# Patient Record
Sex: Female | Born: 1965 | Race: White | Hispanic: No | Marital: Married | Smoking: Former smoker
Health system: Southern US, Community
[De-identification: ages and names within clinical notes are randomized; demographics above are authoritative.]

## PROBLEM LIST (undated history)

## (undated) DIAGNOSIS — E0590 Thyrotoxicosis, unspecified without thyrotoxic crisis or storm: Secondary | ICD-10-CM

## (undated) DIAGNOSIS — M1990 Unspecified osteoarthritis, unspecified site: Secondary | ICD-10-CM

## (undated) DIAGNOSIS — F4323 Adjustment disorder with mixed anxiety and depressed mood: Secondary | ICD-10-CM

## (undated) DIAGNOSIS — E890 Postprocedural hypothyroidism: Secondary | ICD-10-CM

## (undated) DIAGNOSIS — D219 Benign neoplasm of connective and other soft tissue, unspecified: Secondary | ICD-10-CM

## (undated) DIAGNOSIS — T7840XA Allergy, unspecified, initial encounter: Secondary | ICD-10-CM

## (undated) DIAGNOSIS — E0500 Thyrotoxicosis with diffuse goiter without thyrotoxic crisis or storm: Secondary | ICD-10-CM

## (undated) DIAGNOSIS — Z6372 Alcoholism and drug addiction in family: Secondary | ICD-10-CM

## (undated) DIAGNOSIS — E042 Nontoxic multinodular goiter: Secondary | ICD-10-CM

## (undated) HISTORY — PX: SHOULDER ARTHROSCOPY: SHX128

## (undated) HISTORY — DX: Alcoholism and drug addiction in family: Z63.72

## (undated) HISTORY — DX: Thyrotoxicosis with diffuse goiter without thyrotoxic crisis or storm: E05.00

## (undated) HISTORY — DX: Thyrotoxicosis, unspecified without thyrotoxic crisis or storm: E05.90

## (undated) HISTORY — DX: Postprocedural hypothyroidism: E89.0

## (undated) HISTORY — DX: Allergy, unspecified, initial encounter: T78.40XA

## (undated) HISTORY — DX: Unspecified osteoarthritis, unspecified site: M19.90

## (undated) HISTORY — DX: Benign neoplasm of connective and other soft tissue, unspecified: D21.9

## (undated) HISTORY — PX: FOOT SURGERY: SHX648

## (undated) HISTORY — DX: Nontoxic multinodular goiter: E04.2

## (undated) HISTORY — DX: Adjustment disorder with mixed anxiety and depressed mood: F43.23

---

## 1991-05-29 HISTORY — PX: LAPAROSCOPY: SHX197

## 2003-09-29 ENCOUNTER — Other Ambulatory Visit: Admission: RE | Admit: 2003-09-29 | Discharge: 2003-09-29 | Payer: Self-pay | Admitting: *Deleted

## 2004-01-03 ENCOUNTER — Ambulatory Visit (HOSPITAL_COMMUNITY): Admission: RE | Admit: 2004-01-03 | Discharge: 2004-01-03 | Payer: Self-pay | Admitting: *Deleted

## 2004-01-03 ENCOUNTER — Ambulatory Visit (HOSPITAL_BASED_OUTPATIENT_CLINIC_OR_DEPARTMENT_OTHER): Admission: RE | Admit: 2004-01-03 | Discharge: 2004-01-03 | Payer: Self-pay | Admitting: *Deleted

## 2004-01-03 ENCOUNTER — Encounter (INDEPENDENT_AMBULATORY_CARE_PROVIDER_SITE_OTHER): Payer: Self-pay | Admitting: *Deleted

## 2004-06-07 ENCOUNTER — Ambulatory Visit: Payer: Self-pay | Admitting: Family Medicine

## 2004-06-19 ENCOUNTER — Ambulatory Visit: Payer: Self-pay | Admitting: Family Medicine

## 2004-06-26 ENCOUNTER — Ambulatory Visit: Payer: Self-pay | Admitting: Family Medicine

## 2004-07-10 ENCOUNTER — Ambulatory Visit: Payer: Self-pay | Admitting: Family Medicine

## 2004-12-18 ENCOUNTER — Ambulatory Visit (HOSPITAL_COMMUNITY): Admission: RE | Admit: 2004-12-18 | Discharge: 2004-12-18 | Payer: Self-pay | Admitting: *Deleted

## 2005-01-02 ENCOUNTER — Ambulatory Visit: Payer: Self-pay | Admitting: Psychology

## 2005-01-26 ENCOUNTER — Ambulatory Visit: Payer: Self-pay | Admitting: Internal Medicine

## 2005-02-06 ENCOUNTER — Ambulatory Visit: Payer: Self-pay | Admitting: Family Medicine

## 2005-05-08 ENCOUNTER — Ambulatory Visit: Payer: Self-pay | Admitting: Internal Medicine

## 2005-05-28 HISTORY — PX: OTHER SURGICAL HISTORY: SHX169

## 2005-08-08 ENCOUNTER — Ambulatory Visit: Payer: Self-pay | Admitting: Family Medicine

## 2005-08-20 ENCOUNTER — Ambulatory Visit: Payer: Self-pay | Admitting: Psychology

## 2005-09-10 ENCOUNTER — Ambulatory Visit: Payer: Self-pay | Admitting: Psychology

## 2005-12-31 ENCOUNTER — Ambulatory Visit (HOSPITAL_BASED_OUTPATIENT_CLINIC_OR_DEPARTMENT_OTHER): Admission: RE | Admit: 2005-12-31 | Discharge: 2005-12-31 | Payer: Self-pay | Admitting: *Deleted

## 2005-12-31 ENCOUNTER — Encounter (INDEPENDENT_AMBULATORY_CARE_PROVIDER_SITE_OTHER): Payer: Self-pay | Admitting: *Deleted

## 2006-01-01 ENCOUNTER — Encounter: Admission: RE | Admit: 2006-01-01 | Discharge: 2006-01-01 | Payer: Self-pay | Admitting: *Deleted

## 2006-03-07 ENCOUNTER — Ambulatory Visit: Payer: Self-pay | Admitting: Internal Medicine

## 2006-10-24 ENCOUNTER — Ambulatory Visit: Payer: Self-pay | Admitting: Psychology

## 2006-10-24 DIAGNOSIS — F4323 Adjustment disorder with mixed anxiety and depressed mood: Secondary | ICD-10-CM

## 2006-10-24 HISTORY — DX: Adjustment disorder with mixed anxiety and depressed mood: F43.23

## 2006-10-31 ENCOUNTER — Ambulatory Visit: Payer: Self-pay | Admitting: Family Medicine

## 2006-12-02 ENCOUNTER — Ambulatory Visit: Payer: Self-pay | Admitting: Psychology

## 2007-01-07 ENCOUNTER — Encounter: Admission: RE | Admit: 2007-01-07 | Discharge: 2007-01-07 | Payer: Self-pay | Admitting: Obstetrics and Gynecology

## 2007-02-03 ENCOUNTER — Ambulatory Visit: Payer: Self-pay | Admitting: Psychology

## 2007-02-27 ENCOUNTER — Ambulatory Visit: Payer: Self-pay | Admitting: Family Medicine

## 2007-03-13 ENCOUNTER — Ambulatory Visit: Payer: Self-pay | Admitting: Family Medicine

## 2007-04-11 ENCOUNTER — Ambulatory Visit: Payer: Self-pay | Admitting: Internal Medicine

## 2007-04-14 LAB — CONVERTED CEMR LAB
Free T4: 4.2 ng/dL — ABNORMAL HIGH (ref 0.6–1.6)
T3, Free: 19.8 pg/mL — ABNORMAL HIGH (ref 2.3–4.2)
TSH: 0.02 microintl units/mL — ABNORMAL LOW (ref 0.35–5.50)

## 2007-04-22 ENCOUNTER — Encounter (HOSPITAL_COMMUNITY): Admission: RE | Admit: 2007-04-22 | Discharge: 2007-06-06 | Payer: Self-pay | Admitting: Internal Medicine

## 2007-04-25 ENCOUNTER — Telehealth: Payer: Self-pay | Admitting: Internal Medicine

## 2007-04-30 ENCOUNTER — Encounter: Admission: RE | Admit: 2007-04-30 | Discharge: 2007-04-30 | Payer: Self-pay | Admitting: Internal Medicine

## 2007-04-30 ENCOUNTER — Ambulatory Visit: Payer: Self-pay | Admitting: Internal Medicine

## 2007-05-01 ENCOUNTER — Telehealth (INDEPENDENT_AMBULATORY_CARE_PROVIDER_SITE_OTHER): Payer: Self-pay | Admitting: *Deleted

## 2007-05-02 ENCOUNTER — Telehealth (INDEPENDENT_AMBULATORY_CARE_PROVIDER_SITE_OTHER): Payer: Self-pay | Admitting: *Deleted

## 2007-05-02 ENCOUNTER — Ambulatory Visit: Payer: Self-pay | Admitting: Family Medicine

## 2007-05-05 ENCOUNTER — Encounter: Admission: RE | Admit: 2007-05-05 | Discharge: 2007-05-05 | Payer: Self-pay | Admitting: Internal Medicine

## 2007-05-06 ENCOUNTER — Telehealth: Payer: Self-pay | Admitting: Internal Medicine

## 2007-05-13 ENCOUNTER — Ambulatory Visit: Payer: Self-pay | Admitting: Internal Medicine

## 2007-06-13 ENCOUNTER — Ambulatory Visit: Payer: Self-pay | Admitting: Internal Medicine

## 2007-06-18 LAB — CONVERTED CEMR LAB
Free T4: 2.6 ng/dL — ABNORMAL HIGH (ref 0.6–1.6)
T3, Free: 7.4 pg/mL — ABNORMAL HIGH (ref 2.3–4.2)
TSH: 0.03 microintl units/mL — ABNORMAL LOW (ref 0.35–5.50)

## 2007-07-11 ENCOUNTER — Ambulatory Visit: Payer: Self-pay | Admitting: Internal Medicine

## 2007-07-14 LAB — CONVERTED CEMR LAB
T3, Free: 3.7 pg/mL (ref 2.3–4.2)
TSH: 0.02 microintl units/mL — ABNORMAL LOW (ref 0.35–5.50)

## 2007-07-16 ENCOUNTER — Ambulatory Visit: Payer: Self-pay | Admitting: Internal Medicine

## 2007-08-08 ENCOUNTER — Ambulatory Visit: Payer: Self-pay | Admitting: Internal Medicine

## 2007-08-11 LAB — CONVERTED CEMR LAB: Free T4: 0.7 ng/dL (ref 0.6–1.6)

## 2007-08-18 ENCOUNTER — Ambulatory Visit: Payer: Self-pay | Admitting: Family Medicine

## 2007-09-15 ENCOUNTER — Ambulatory Visit: Payer: Self-pay | Admitting: Sports Medicine

## 2007-09-19 ENCOUNTER — Ambulatory Visit: Payer: Self-pay | Admitting: Internal Medicine

## 2007-09-29 ENCOUNTER — Ambulatory Visit: Payer: Self-pay | Admitting: Sports Medicine

## 2007-10-16 ENCOUNTER — Ambulatory Visit: Payer: Self-pay | Admitting: Sports Medicine

## 2007-10-23 ENCOUNTER — Ambulatory Visit: Payer: Self-pay | Admitting: Internal Medicine

## 2007-10-23 LAB — CONVERTED CEMR LAB: Free T4: 0.9 ng/dL (ref 0.6–1.6)

## 2007-10-27 ENCOUNTER — Ambulatory Visit: Payer: Self-pay | Admitting: Family Medicine

## 2007-10-29 ENCOUNTER — Ambulatory Visit: Payer: Self-pay | Admitting: Internal Medicine

## 2007-12-11 ENCOUNTER — Ambulatory Visit: Payer: Self-pay | Admitting: Internal Medicine

## 2007-12-16 LAB — CONVERTED CEMR LAB
Free T4: 1.2 ng/dL (ref 0.6–1.6)
T3, Free: 4.3 pg/mL — ABNORMAL HIGH (ref 2.3–4.2)

## 2007-12-22 ENCOUNTER — Ambulatory Visit: Payer: Self-pay | Admitting: Family Medicine

## 2008-01-08 ENCOUNTER — Encounter: Admission: RE | Admit: 2008-01-08 | Discharge: 2008-01-08 | Payer: Self-pay

## 2008-01-08 ENCOUNTER — Ambulatory Visit: Payer: Self-pay | Admitting: Endocrinology

## 2008-01-08 DIAGNOSIS — E042 Nontoxic multinodular goiter: Secondary | ICD-10-CM | POA: Insufficient documentation

## 2008-01-08 HISTORY — DX: Nontoxic multinodular goiter: E04.2

## 2008-02-18 ENCOUNTER — Ambulatory Visit: Payer: Self-pay | Admitting: Endocrinology

## 2008-02-23 ENCOUNTER — Encounter (HOSPITAL_COMMUNITY): Admission: RE | Admit: 2008-02-23 | Discharge: 2008-05-05 | Payer: Self-pay | Admitting: Endocrinology

## 2008-02-24 ENCOUNTER — Ambulatory Visit: Payer: Self-pay | Admitting: Family Medicine

## 2008-02-26 ENCOUNTER — Ambulatory Visit: Payer: Self-pay | Admitting: Internal Medicine

## 2008-07-19 ENCOUNTER — Ambulatory Visit: Payer: Self-pay | Admitting: Endocrinology

## 2008-07-19 DIAGNOSIS — E890 Postprocedural hypothyroidism: Secondary | ICD-10-CM

## 2008-07-19 DIAGNOSIS — E039 Hypothyroidism, unspecified: Secondary | ICD-10-CM | POA: Insufficient documentation

## 2008-07-19 HISTORY — DX: Postprocedural hypothyroidism: E89.0

## 2008-07-20 LAB — CONVERTED CEMR LAB: TSH: 4.05 microintl units/mL (ref 0.35–5.50)

## 2008-09-02 ENCOUNTER — Ambulatory Visit: Payer: Self-pay | Admitting: Psychology

## 2009-01-20 ENCOUNTER — Encounter: Admission: RE | Admit: 2009-01-20 | Discharge: 2009-01-20 | Payer: Self-pay | Admitting: Obstetrics and Gynecology

## 2009-02-04 ENCOUNTER — Telehealth: Payer: Self-pay | Admitting: Endocrinology

## 2009-04-26 ENCOUNTER — Encounter: Payer: Self-pay | Admitting: Internal Medicine

## 2009-08-18 ENCOUNTER — Ambulatory Visit: Payer: Self-pay | Admitting: Endocrinology

## 2009-08-18 LAB — CONVERTED CEMR LAB: TSH: 4.18 microintl units/mL (ref 0.35–5.50)

## 2009-08-23 ENCOUNTER — Telehealth (INDEPENDENT_AMBULATORY_CARE_PROVIDER_SITE_OTHER): Payer: Self-pay | Admitting: *Deleted

## 2009-11-22 ENCOUNTER — Telehealth: Payer: Self-pay | Admitting: Psychology

## 2009-11-29 ENCOUNTER — Telehealth: Payer: Self-pay | Admitting: Psychology

## 2010-01-05 ENCOUNTER — Ambulatory Visit: Payer: Self-pay | Admitting: Psychology

## 2010-01-26 ENCOUNTER — Encounter: Admission: RE | Admit: 2010-01-26 | Discharge: 2010-01-26 | Payer: Self-pay | Admitting: Obstetrics and Gynecology

## 2010-05-24 ENCOUNTER — Encounter: Payer: Self-pay | Admitting: Internal Medicine

## 2010-06-18 ENCOUNTER — Encounter: Payer: Self-pay | Admitting: Internal Medicine

## 2010-06-27 NOTE — Assessment & Plan Note (Signed)
Summary: Behavioral Medicine Follow-up   Primary Care Allison Campos:  Community PCP   History of Present Illness: Allison Campos presents since her last appt in April of this past year.  She says not much has changed in terms of her relationship with her husband.  She continues to work full-time and enjoys that.  Her focus is on friendships again this visit.    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  Affect is consistent.  She has close, consistent / stable relationships with several individuals.  They are long-term friendships and all of them are long-distance.  She has engaged in a number of attempts to find relationships in [place] and has come up wanting.  She wonders whether it is something about her.  Sounds like she is taking good opportunities.  There is a chance that her need is a little great and people pick up on that.  We talked about intensity.  It could also be that her standards are really very high (appropriately so) and she isn't willing to settle.  We likened it to dating relationships and finding someone you want to "marry into a friendship."  She has more superficial friendships in [place] but has not found "the one."  Reassurance seemed to be a good experience for her along with validation.  She is leaving for [place] tomorrow to visit her mom and reconnect with long-term friends.  She is excited.  She will call as needed.   Orders: Therapy 40-50- min- [facility] (81191)  Complete Medication List: 1)  Fluticasone Propionate 50 Mcg/act Susp (Fluticasone propionate) .... Once daily as needed 2)  Zyrtec Allergy 10 Mg Tabs (Cetirizine hcl) .... Take 1 tablet by mouth once a day as needed 3)  Viactiv Multi-vitamin Chew (Multiple vitamins-calcium) .... Qd

## 2010-06-27 NOTE — Assessment & Plan Note (Signed)
Summary: f/u appt per pt/#/cd   Vital Signs:  Patient profile:   45 year old female Height:      63 inches (160.02 cm) Weight:      153.25 pounds (69.66 kg) BMI:     27.25 O2 Sat:      98 % on Room air Temp:     97.2 degrees F (36.22 degrees C) oral Pulse rate:   77 / minute BP sitting:   112 / 78  (left arm) Cuff size:   regular  Vitals Entered By: Josph Macho RMA (August 18, 2009 8:15 AM)  O2 Flow:  Room air CC: Follow-up visit/ pt states she is no longer taking Promethazine or Levothyroxine Sodium/ CF Is Patient Diabetic? No   Referring Provider:  Dr. Cato Mulligan Primary Provider:  Community PCP  CC:  Follow-up visit/ pt states she is no longer taking Promethazine or Levothyroxine Sodium/ CF.  History of Present Illness: pt had i-131 rx for hyperthyroidism in 2008.  she does not take the synthroid, or any other thyroid medication.  she does not notice the goiter.     Current Medications (verified): 1)  Fluticasone Propionate 50 Mcg/act Susp (Fluticasone Propionate) .... Once Daily As Needed 2)  Zyrtec Allergy 10 Mg  Tabs (Cetirizine Hcl) .... Take 1 Tablet By Mouth Once A Day As Needed 3)  Viactiv Multi-Vitamin   Chew (Multiple Vitamins-Calcium) .... Qd 4)  Promethazine Hcl 25 Mg  Tabs (Promethazine Hcl) .... One Every 8 Hrs As Needed 5)  Levothyroxine Sodium 50 Mcg  Tabs (Levothyroxine Sodium) .[name].. 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 Married Former Smoker Alcohol use-yes Drug use-no Regular  exercise-yes works as cpa  Review of Systems       pt c/o cold intolerance  Physical Exam  General:  normal appearance.   Neck:  thyroid slightly enlarged with irregular surface, but no discrete nodule. Additional Exam:  FastTSH                   4.18 uIU/mL      Impression & Recommendations:  Problem # 1:  HYPOTHYROIDISM, POST-RADIATION (ICD-244.1) no med is needed now  Problem # 2:  GOITER, MULTINODULAR (ICD-241.1) apparently improved  Other Orders: TLB-TSH (Thyroid Stimulating Hormone) (84443-TSH) Est. Patient Level III (29562)  Patient Instructions: 1)  tests are being ordered for you today.  a few days after the test(s), please call [phone] to hear your test results. 2)  Please schedule a follow-up appointment in 1 year. 3)  (update: i left message on phone-tree:  rx as we discussed)

## 2010-06-27 NOTE — Progress Notes (Signed)
Summary: Reschedule beh med  Phone Note Call from Patient   Caller: Patient Call For: Spero Geralds, Psy.D. Summary of Call: patient left VM on Friday canceling appt.  We rescheduled for August 11th at 3:00. Initial call taken by: Spero Geralds PsyD,  November 29, 2009 2:35 PM

## 2010-06-27 NOTE — Progress Notes (Signed)
----   Converted from flag ---- ---- 08/23/2009 8:51 AM, Minus Breeding MD wrote: she does not need any treatment for the thyroid, as blood test was normal.  ret 1 year.  ---- 08/23/2009 8:23 AM, Josph Macho RMA wrote: I don't see what RX you want pt to continue and pt states she doesn't remember anything being discussed. ------------------------------  Phone Note Outgoing Call   Summary of Call: Left message for pt to return my call. Initial call taken by: Josph Macho RMA,  August 23, 2009 9:13 AM  Follow-up for Phone Call        Informed pt Follow-up by: Josph Macho RMA,  August 23, 2009 10:18 AM

## 2010-06-27 NOTE — Progress Notes (Signed)
Summary: Schedule Beh-med  Phone Note Call from Patient   Caller: Patient Call For: Spero Geralds, Psy.D. Summary of Call: Patient called to schedule.  July 5th at 11:00. Initial call taken by: Spero Geralds PsyD,  November 22, 2009 2:12 PM

## 2010-06-29 NOTE — Miscellaneous (Signed)
Summary: Immunization Entry   Immunization History:  Influenza Immunization History:    Influenza:  historical (05/22/2010)

## 2010-10-13 NOTE — Op Note (Signed)
Allison Campos, Allison Campos                           ACCOUNT NO.:  [id number]   MEDICAL RECORD NO.:  [id number]                   PATIENT TYPE:  AMB   LOCATION:  [facility]                                 FACILITY:  [facility]   PHYSICIAN:  Pershing Cox, M.D.            DATE OF BIRTH:  August 26, 1965   DATE OF PROCEDURE:  01/03/2004  DATE OF DISCHARGE:                                 OPERATIVE REPORT   PREOPERATIVE DIAGNOSIS:  Metrorrhagia and endometrial polyp.   POSTOPERATIVE DIAGNOSIS:  Metrorrhagia and endometrial polyp.   PROCEDURE:  1. Exam under anesthesia.  2. Fractional D&C.  3. Hysteroscopy with resection of endometrial polyp and small uterine myoma.   SURGEON:  Pershing Cox, M.D.   INDICATIONS FOR PROCEDURE:  This patient has a history of bleeding between  menstrual periods.  She had had a work-up in the past in [place] and in  our office had a sonogram performed which showed clear evidence of a polyp.  She is brought to the operating room today for resection.   OPERATIVE FINDINGS:  The patient's uterus is retroverted.  The cavity sounds  to 8 cm.  There was a polyp filling a small uterine fundus.  There was also  firm, textured mass on the anterior fundus which was resected and is  compatible with a small myoma.   DESCRIPTION OF PROCEDURE:  Allison Campos was brought to the operating room  with an IV in place.  She was placed supine on the OR table, and IV sedation  was administered.  LMA for general endotracheal anesthesia was then  administered.  She was placed into Allen stirrups, and Hibiclens was used to  prep her vagina, perineum, and upper thighs.  She was then draped for a  sterile vaginal procedure.  The bladder was emptied with a sterile catheter.  Bivalve speculum was introduced into the vagina, and the cervix was  visualized.  Marcaine 0.25% was injected into the anterior cervix which was  then grasped with a single-tooth tenaculum.  Paracervical block was  administered at the 3, 4, 7, and 8 positions using a total volume of 20 mL  of this solution.  Kevorkian curette was used to obtain endocervical  curettings, the uterine sound then passed to a depth of 8 cm in a  retroverted position.  Serial Pratt dilators were used to dilate the cervix  to size 33 and then 35, and the resectoscope was introduced.  Using through-  and-through sorbitol irrigation, the cavity was visualized.  Polyp was seen  in the anterior fundus.  Portions of this polyp were resected, and then  polyp forceps were used to remove the large bulk of the polyp.  The  resectoscope was reintroduced, and the endometrial wall was explored with  the loop.  There was an irregular area on the anterior fundus which was  resected with 110-110 blend 1 current.  The cavity was inspected  and made  hemostatic.  There was no evidence of other filling defects.  The small  sharp curette was then used to thoroughly  curette all the endometrial walls at the end of the procedure.  The sound  then passed again to a depth of 8 cm.  Fluid deficit 200 mL on soaked  sponges.  Specimens:  Endocervical curettings, endometrial polyp, and  endometrial curettings.  Complications none.                                               Pershing Cox, M.D.    MAJ/MEDQ  D:  01/03/2004  T:  01/03/2004  Job:  [id number]

## 2010-10-13 NOTE — Op Note (Signed)
NAMEAUDREYANA, Allison Campos               ACCOUNT NO.:  [id number]   MEDICAL RECORD NO.:  [id number]          PATIENT TYPE:  AMB   LOCATION:  [facility]                         [facility]AC[facility]   PHYSICIAN:  Pershing Cox, M.D.DATE OF BIRTH:  1966-03-24   DATE OF PROCEDURE:  12/31/2005  DATE OF DISCHARGE:                                 OPERATIVE REPORT   PREOPERATIVE DIAGNOSES:  1.  Dysfunctional uterine bleeding.  2.  Endometrial polyp on hydrosonogram.   POSTOPERATIVE DIAGNOSES:  1.  Dysfunctional uterine bleeding.  2.  Endometrial polyp on hydrosonogram.   PROCEDURE:  1.  Examination under anesthesia.  2.  Fractional dilatation and curettage.  3.  Hysteroscopy with resection.   SURGEON:  Pershing Cox, M.D.   ASSISTANT:  None.   INDICATIONS FOR PROCEDURE:  Hevin Jeffcoat is 29.  She has a history of  dysfunctional uterine bleeding and in the 2005 had a D&C, hysteroscopy with  resection of an endometrial polyp.  She presented this year at annual  examination with persistent symptoms.  Sonogram was performed which showed a  thickened endometrium.  This was followed by hydrosonogram which showed a  small fundal polyp and she is brought to the operating room today for  removal.   OPERATIVE FINDINGS:  The patient's endometrial cavity sounded to a depth of  9 cm.  There were several strips of endometrium hanging from the fundus  which were easily removed.  There were polypoid fragments of endometrium  along the left uterine wall.   PROCEDURE:  Dani Wallner was brought to the operating room with an IV in  place.  In the holding area, she received 1 gram of Ancef.  Supine on the OR  table, IV sedation was administered.  LMA was easily placed and she was then  placed into Allen stirrups.  Betadine was used to prep the perineum and  vagina.  A red rubber catheter was used to empty the bladder and sterile  linens were then applied.  Exam under anesthesia was performed.  Bivalve  speculum was inserted to the vagina and the cervix was grasped with a single-  tooth tenaculum.  Marcaine 0.25% was used to instill a 10 mL paracervical  block injecting at the 3, 4, 7 and 8 positions.  Endocervical curettings  were collected with a Kevorkian curet onto Telfa.  The uterus sound passed  to a depth of 9 cm, deviated slightly to the patient's right.  Serial Pratt  dilators were used to dilate to size 33 and the resectoscope was then  introduced.  Using through-and-through sorbitol irrigation, the cavity was  visualized and photographs were taken.  Next, the strips of endometrium were  approached using the right angle wire; however, they were very fragile.  The  hysteroscope was removed and the endometrial curettings were collected with  a #1 curet on to Telfa.  Once this had been completed, the resectoscope was  reintroduced into the cavity and the uterine walls were then visualized and  using the right  angle wire, serially curetted with the wire.  Where irregularities were  noted, this was resected.  After I had completed this, the resectoscope was  removed and the Meigs curet was used to curet the endometrial walls.  This  was again collected onto Telfa.  The cavity sounded 9 cm.  The procedure was  terminated.      Pershing Cox, M.D.  Electronically Signed     MAJ/MEDQ  D:  12/31/2005  T:  12/31/2005  Job:  [id number]

## 2010-12-13 ENCOUNTER — Telehealth: Payer: Self-pay | Admitting: Psychology

## 2010-12-13 NOTE — Telephone Encounter (Signed)
Requesting therapy referral for her and her son.  Gave Judithann Sauger and Tom Hedding names and numbers.  Also requesting therapy appt for herself.  Scheduled for July 27th at 9:00 a.m.

## 2010-12-22 ENCOUNTER — Ambulatory Visit (INDEPENDENT_AMBULATORY_CARE_PROVIDER_SITE_OTHER): Payer: BC Managed Care – PPO | Admitting: Psychology

## 2010-12-22 DIAGNOSIS — F4323 Adjustment disorder with mixed anxiety and depressed mood: Secondary | ICD-10-CM

## 2010-12-22 NOTE — Assessment & Plan Note (Signed)
Did not assess mood.  Affect is sad.  She is tearful throughout.  She would like to be detached as she sees the emotion as a sign that she is still connected.  Discussed this and normalized her conflicted thoughts and feelings.  No evidence of suicidal or homicidal ideation.  She is emotionally disparaging to herself (calls herself names).  No self-harm (i.e. Cutting).  Alcohol use is concerning to her.  She reports 1-2 glasses of wine per evening.  Will monitor.  She is at the beach next week with her boys.  Will meet the following week to see how she is doing.  In addition to patient instructions, provided a bibliotherapy resource on being kind to herself as she navigates tough waters.

## 2010-12-22 NOTE — Patient Instructions (Signed)
Please schedule a follow-up for:  August 8th at 9:00. Consider time frames - when does option A no longer apply?  What is option B? Stupid is no longer part of your vocabulary.  You will have to check yourself and you have practice at this.  Other words might be unhealthy or not the best decision.  There remain valid reasons for the decisions you are making on a daily basis.  Be gentle with yourself.

## 2010-12-22 NOTE — Progress Notes (Signed)
Allison Campos presents after a long hiatus from therapy.  She reports things have further deteriorated with her husband and she feels like she needs a plan to get out so she can maintain her own health.  Discussed best case scenario:  Her husband will lose his job on August 11th.  There is a chance he could find a new job in another state which would give Tempie the room to rebuild her sense of self and make some decisions.  Discussed other scenarios as well.  Her goals for the visit in addition to talking plans was to identify how to set reasonable boundaries and to work on not being so hard on herself.

## 2010-12-29 ENCOUNTER — Other Ambulatory Visit: Payer: Self-pay | Admitting: Obstetrics and Gynecology

## 2010-12-29 DIAGNOSIS — Z1231 Encounter for screening mammogram for malignant neoplasm of breast: Secondary | ICD-10-CM

## 2011-01-03 ENCOUNTER — Ambulatory Visit (INDEPENDENT_AMBULATORY_CARE_PROVIDER_SITE_OTHER): Payer: BC Managed Care – PPO | Admitting: Psychology

## 2011-01-03 DIAGNOSIS — F4323 Adjustment disorder with mixed anxiety and depressed mood: Secondary | ICD-10-CM

## 2011-01-03 NOTE — Assessment & Plan Note (Signed)
Report of mood is better than last meeting.  Affect is consistent with this.  She is tearful on a few occasions - less down on herself and more fear based (I think).    She likes to be in control and so she has some next steps in mind in order to feel like she is.  Continued visualization of the future is a piece of this as well.  She wishes to meet again to ensure she remains on the right track emotionally.  Scheduled for: August 28th at 9:00.

## 2011-01-03 NOTE — Progress Notes (Signed)
Allison Campos reported that the week at the beach was good.  She used walks to self-reflect and envision things for the future.  She thinks this is important in terms of preparing her for the next steps.  She plans on asking for marital counseling.  She is doubtful her husband will accept (or change) but she wants to be able to say she has done everything she could.  She does not want to be divorced but has come to the realization that if their marriage does not improve, it is either him or her and she can not sacrifice herself.  She reports she is doing better with negative self-talk.  She has also started noticing when she does things well and compliments herself on these occasions.  Her husband may not lose his job on the 11th after all.  Everything is up in the air.  He might have a second interview with a company in [place].

## 2011-01-23 ENCOUNTER — Ambulatory Visit (INDEPENDENT_AMBULATORY_CARE_PROVIDER_SITE_OTHER): Payer: BC Managed Care – PPO | Admitting: Psychology

## 2011-01-23 DIAGNOSIS — F4323 Adjustment disorder with mixed anxiety and depressed mood: Secondary | ICD-10-CM

## 2011-01-23 NOTE — Progress Notes (Signed)
Allison Campos presents today having made progress on her goals.  She had a conversation with her husband and she feels good about her approach.  He is not in favor of marital counseling but wants to change his behavior and has made some small changes.  She is clear they need a structured approach and is looking into a marriage encounter weekend.  Continues to set limits.  Reviewed these.  Is doing some "work" with a friend around developing her authentic self (friend is doing the same).  Enjoying this and getting something out of it.

## 2011-01-23 NOTE — Assessment & Plan Note (Signed)
Mood is good.  Affect is consistent.  She is in a better place emotionally than when she first presented this time.  She feels more in control and has a plan.  She is far less negative in her thinking and down on herself.  Provided a DVD resource that is a structured marital therapy workshop to present as an option to her husband along with the marriage retreat.  Will follow-up on that.  Will also follow-up on negative self-talk next visit as that was not addressed today (although it appears to be improved).  Follow-up three weeks or prn.

## 2011-01-30 ENCOUNTER — Ambulatory Visit
Admission: RE | Admit: 2011-01-30 | Discharge: 2011-01-30 | Disposition: A | Discharge status: Home / Self Care | Payer: BC Managed Care – PPO | Source: Ambulatory Visit | Attending: Obstetrics and Gynecology | Admitting: Obstetrics and Gynecology

## 2011-01-30 DIAGNOSIS — Z1231 Encounter for screening mammogram for malignant neoplasm of breast: Secondary | ICD-10-CM

## 2011-02-13 ENCOUNTER — Ambulatory Visit (INDEPENDENT_AMBULATORY_CARE_PROVIDER_SITE_OTHER): Payer: BC Managed Care – PPO | Admitting: Psychology

## 2011-02-13 DIAGNOSIS — F4323 Adjustment disorder with mixed anxiety and depressed mood: Secondary | ICD-10-CM

## 2011-02-14 NOTE — Assessment & Plan Note (Signed)
Mood is reported as good.  Affect is consistent.  She remains in a good place.  I think the shifts in the marriage are related to a feedback loop.  Her confidence and resolve grew and when she most recently set the limit, her husband responded appropriately (and surprisingly).  This created changes in [place] which likely created further positive changes in her husband.    Discussed how marriages (even healthy ones) go through ups and downs.  Allison Campos acknowledged it has not been perfect but because things are so much better, she is able to manage the low points well.    She elected to table seeing a lawyer for right now because it is inconsistent with her long-term goal and the current state of her marriage. Discussed warning signs that she would need to revisit.    She elected to schedule in about a month to keep checking in.  She may want to borrow DVD resource again as her husband has agreed to do it.

## 2011-02-14 NOTE — Progress Notes (Signed)
Allison Campos presents for follow-up.  She reports things continue to go well with her husband.  She had a plan in place to contact a lawyer to get her "ducks in a row" but has not yet followed through.  We discussed this.    Reviewed current state of things as well as how the decisions she made in the past are playing out.

## 2011-03-23 ENCOUNTER — Other Ambulatory Visit (INDEPENDENT_AMBULATORY_CARE_PROVIDER_SITE_OTHER): Payer: BC Managed Care – PPO

## 2011-03-23 ENCOUNTER — Ambulatory Visit (INDEPENDENT_AMBULATORY_CARE_PROVIDER_SITE_OTHER): Payer: BC Managed Care – PPO | Admitting: Endocrinology

## 2011-03-23 ENCOUNTER — Encounter: Payer: Self-pay | Admitting: Endocrinology

## 2011-03-23 VITALS — BP 126/76 | HR 72 | Temp 98.7°F

## 2011-03-23 DIAGNOSIS — E042 Nontoxic multinodular goiter: Secondary | ICD-10-CM

## 2011-03-23 LAB — TSH: TSH: 9.41 u[IU]/mL — ABNORMAL HIGH (ref 0.35–5.50)

## 2011-03-23 MED ORDER — LEVOTHYROXINE SODIUM 50 MCG PO TABS: 50.0000 ug | ORAL_TABLET | Freq: Every day | ORAL | Status: DC

## 2011-03-23 NOTE — Patient Instructions (Addendum)
blood tests are being requested for you today.  please call [phone] to hear your test results.  You will be prompted to enter the 9-digit "MRN" number that appears at the top left of this page, followed by #.  Then you will hear the message. Please return in 1 year. most of the time, a "lumpy thyroid" will eventually become overactive.  this is usually a slow process, happening over the span of many years. (update: i left message on phone-tree:  Take synthroid 50/d.  Recheck tsh in 6 weeks).

## 2011-03-23 NOTE — Progress Notes (Signed)
  Subjective:    Patient ID: Allison Campos, female    DOB: [birth year], 45 y.o.   MRN: [id number]  HPI In 2008, pt had i-131 rx for hyperthyroidism, due to a multinodular goiter.  she does not take the synthroid, or any other thyroid medication.  she does not notice the goiter.  Past Medical History  Diagnosis Date  . HYPOTHYROIDISM, POST-RADIATION 07/19/2008  . GOITER, MULTINODULAR 01/08/2008  . 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 Hyperthyroidism   . Grave's disease   . Alcoholism in family     Past Surgical History  Procedure Date  . Shoulder arthroscopy     left  . Laparoscopy     Endometriosis  . Cesarean section     x2    History   Social History  . Marital Status: Married    Spouse Name: N/A    Number of Children: N/A  . Years of Education: N/A   Occupational History  . CPA    Social History Main Topics  . Smoking status: Former Games developer  . Smokeless tobacco: Not on file  . Alcohol Use: Yes  . Drug Use: No  . Sexually Active:    Other Topics Concern  . Not on file   Social History Narrative   Regular exercise-yes    Current Outpatient Prescriptions on File Prior to Visit  Medication Sig Dispense Refill  . cetirizine (ZYRTEC) 10 MG tablet Take 10 mg by mouth daily as needed.        . Calcium-Vitamin D-Vitamin K (VIACTIV) 500-100-40 MG-UNT-MCG CHEW Chew 1 tablet by mouth daily.          No Known Allergies  Family History  Problem Relation Age of Onset  . Alcohol abuse Other     Family History of Alcholism/Addiction  . Arthritis Other     Family History of  . Cancer Other     Family History of Ovarian/Uterine Cancer  . Thyroid disease Other     not in immediate family  . Cancer Mother     Breast Cancer    BP 126/76  Pulse 72  Temp(Src) 98.7 F (37.1 C) (Oral)  SpO2 97%    Review of Systems She has slight sensation of dysphagia    Objective:   Physical Exam VITAL SIGNS:  See vs page GENERAL: no distress Thyroid: there  is a 1-2 cm left nodule.  i think i can note some fullness on the right, but i can't tell for sure.    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

## 2011-03-30 ENCOUNTER — Ambulatory Visit (INDEPENDENT_AMBULATORY_CARE_PROVIDER_SITE_OTHER): Payer: BC Managed Care – PPO | Admitting: Psychology

## 2011-03-30 DIAGNOSIS — F4323 Adjustment disorder with mixed anxiety and depressed mood: Secondary | ICD-10-CM

## 2011-03-30 NOTE — Assessment & Plan Note (Signed)
Report of mood is fine.  Affect is consistent.  She seems more resigned than sad.  Expressed my surprise that she didn't bring this switch to her husband's attention (given how big the switch was and how much impact it had on her).  She will contemplate that.  She has good strategies moving forward to deal with her boys.

## 2011-03-30 NOTE — Progress Notes (Signed)
Things have shifted back to old patterns.  Allison Campos is feeling very done working at the marriage and is contemplating taking the next steps (but still doesn't plan on leaving any time soon).  Discussed impact on boys and how she can attenuate her husband's negativity, specifically when it is directed toward her.

## 2011-05-03 ENCOUNTER — Ambulatory Visit (INDEPENDENT_AMBULATORY_CARE_PROVIDER_SITE_OTHER): Payer: BC Managed Care – PPO | Admitting: Psychology

## 2011-05-03 DIAGNOSIS — F4323 Adjustment disorder with mixed anxiety and depressed mood: Secondary | ICD-10-CM

## 2011-05-03 NOTE — Progress Notes (Signed)
Julian presents for follow-up.  Things are as the same or worse for [facility].  She is grappling with the same issues with regards to whether she should stay in this relationship and risk losing herself and a chance at a healthy relationship.

## 2011-05-03 NOTE — Assessment & Plan Note (Signed)
Report of mood is sad.  Affect is consistent.  She is tearful in the office and reportedly tearful earlier today as well.  Wondered about the possibility of depression however function seems to be in tact and this is very situational in nature.  Experiences joy when with her boys.    She is taking the Marriage DVD collection to see if Gabriel Rung will participate (anticipating that he won't).  Provided her another video to review on vulnerability - relating it to trying to help her outsides and her insides match a little better.  Will follow up on these two things next visit.  Scheduled for January 3rd at 4:00.

## 2011-05-18 ENCOUNTER — Other Ambulatory Visit (INDEPENDENT_AMBULATORY_CARE_PROVIDER_SITE_OTHER): Payer: BC Managed Care – PPO

## 2011-05-18 DIAGNOSIS — E042 Nontoxic multinodular goiter: Secondary | ICD-10-CM

## 2011-05-18 LAB — TSH: TSH: 3.98 u[IU]/mL (ref 0.35–5.50)

## 2011-06-21 ENCOUNTER — Ambulatory Visit (INDEPENDENT_AMBULATORY_CARE_PROVIDER_SITE_OTHER): Payer: BC Managed Care – PPO | Admitting: Psychology

## 2011-06-21 DIAGNOSIS — F4323 Adjustment disorder with mixed anxiety and depressed mood: Secondary | ICD-10-CM

## 2011-06-21 NOTE — Assessment & Plan Note (Signed)
Report of mood is sad.  She keeps it under wraps.  Crying and exercise help.  No thoughts of SI / HI.  Her thoughts were not as organized as what is typical and she has good awareness of that.  Attempted to help her make sense of some of the thoughts in her head and develop a plan moving forward.  Right now (although this might change), she thinks she needs to do a couple of things Psychiatric nurse, talk with mom, journal).  Discussed things to watch out for in this regard including setting her mom up for success and the ins and outs of journaling (for herself vs. A letter to her sons).  Started yoga.  Thinks it is useful.    She elected to return on 06/29/11 at 9:00.

## 2011-06-21 NOTE — Progress Notes (Signed)
Allison Campos presents for follow-up after about a month and a half.  She says she is not in a good place.  Things have not really changed all that much but she is less able to tolerate the tension and the treatment from her husband and the impact she thinks it is having on her sons.  She is weighing options and her report today is she needs to "dump" her brain of the myriad of thoughts in her head.

## 2011-06-29 ENCOUNTER — Ambulatory Visit (INDEPENDENT_AMBULATORY_CARE_PROVIDER_SITE_OTHER): Payer: BC Managed Care – PPO | Admitting: Psychology

## 2011-06-29 DIAGNOSIS — F4323 Adjustment disorder with mixed anxiety and depressed mood: Secondary | ICD-10-CM

## 2011-06-29 NOTE — Assessment & Plan Note (Addendum)
Report of mood is euthymic with pockets of irritability.  Affect is within normal limits. Assessed specifically for depressive symptoms and she is not meeting criteria.  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  Discussed ways to manage it better.  Exercise is huge and helpful for her.  She exercises regularly.  Recently taken up yoga because of the potential beneficial effects on stress / feeling centered.  Will follow up in three weeks or as needed.  Appt scheduled for:  February 21 at 9:00.

## 2011-06-29 NOTE — Patient Instructions (Signed)
Irritability can be a sign of stress or negative feelings about oneself.  We talked about two strategies for combating irritability:  Kindness toward yourself and kindness toward others. To be more kind to yourself, ask yourself this question (when you realize you are beating yourself up):  What is the worst thing that could (or did) happen?  What does it say about me?  Is there anything I want to do differently? Most times by practicing kindness toward others, you will notice an automatic shift in yourself.  Being kind to others when you are irritable is not easy.  And you won't get it right every time.  It requires some presence / awareness in order to choose kindness.

## 2011-06-29 NOTE — Progress Notes (Signed)
Allison Campos presents for follow-up.  This was a pretty quick follow-up interval so she has less on her mind today.  She notes some irritability and procrastination both with work and home stuff.  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

## 2011-07-19 ENCOUNTER — Ambulatory Visit: Payer: BC Managed Care – PPO | Admitting: Psychology

## 2011-08-02 ENCOUNTER — Ambulatory Visit (INDEPENDENT_AMBULATORY_CARE_PROVIDER_SITE_OTHER): Payer: BC Managed Care – PPO | Admitting: Psychology

## 2011-08-02 DIAGNOSIS — F4323 Adjustment disorder with mixed anxiety and depressed mood: Secondary | ICD-10-CM

## 2011-08-02 NOTE — Progress Notes (Signed)
Allison Campos presented for follow-up.  She reports she is doing well.  Less irritable.  When she is irritable, she hears my voice and works on Actor.  She thinks she needs to work on being less critical of herself.  Provided an example that we worked through.

## 2011-08-02 NOTE — Assessment & Plan Note (Signed)
Report of mood is euthymic.  Affect is consistent.  Thoughts are clear and goal directed.  Situation revealed that she felt okay about her decision in the moment but in retrospect, regretted it and "beat herself up."  Identifying distortions like all or none thinking and magnification and minimization, we reviewed the situation and she easily determined that she had done some "right" things and that it was a good learning experience.  Reviewed steps again for doing this moving forward.    Scheduled for about a month out.  She will go to [facility] and [place] with her boys over spring break.

## 2011-08-16 ENCOUNTER — Ambulatory Visit: Payer: BC Managed Care – PPO | Admitting: Psychology

## 2011-08-30 ENCOUNTER — Ambulatory Visit (INDEPENDENT_AMBULATORY_CARE_PROVIDER_SITE_OTHER): Payer: BC Managed Care – PPO | Admitting: Psychology

## 2011-08-30 DIAGNOSIS — F4323 Adjustment disorder with mixed anxiety and depressed mood: Secondary | ICD-10-CM

## 2011-08-30 NOTE — Progress Notes (Signed)
Allison Campos presents for follow-up.  She got back from her trip west late last night.  It was a huge success with fun for her boys, time with her mom and connection with some long-term girlfriends.  Discussed a couple of issues including what she disclosed to her girlfriends.  She was concerned about not presenting positively enough and also, betraying her husband / vows by sharing honestly.  Also discussed how she is feeling these days in terms of her decision to stay.

## 2011-08-30 NOTE — Assessment & Plan Note (Signed)
Reports feeling "rejuvenated" since her trip.  Affect is good.  Thoughts are clear and goal directed.  Wanted her to take some ownership for her interaction with her friends - recognizing that she deserves to be genuine and authentic with people she has traditionally had deep connections with.  She struggled with coming up with this herself but when I floated it out there, it resonated with her.  The idea that she could have been less honest and walked away feeling less fulfilled / "rejuvenated" hadn't occurred to her.  She elected to follow-up in one month (May 3rd at 9:00).

## 2011-09-28 ENCOUNTER — Ambulatory Visit: Payer: BC Managed Care – PPO | Admitting: Psychology

## 2011-10-12 ENCOUNTER — Ambulatory Visit (INDEPENDENT_AMBULATORY_CARE_PROVIDER_SITE_OTHER): Payer: BC Managed Care – PPO | Admitting: Psychology

## 2011-10-12 DIAGNOSIS — F4323 Adjustment disorder with mixed anxiety and depressed mood: Secondary | ICD-10-CM

## 2011-10-12 NOTE — Progress Notes (Signed)
Allison Campos reports for follow-up.  She is in a good place.  Taking a 5:00 a.m. Boot camp and has noticed camaraderie with other participants.  Also attuned to other connections - both big and small and feeling more settled in.  Really pleased with the relationships with her boys and the trajectory of their lives.  Reinforces her decision to stay.  Discussed.

## 2011-10-12 NOTE — Assessment & Plan Note (Signed)
Patient is in a good place right now although does report some melancholy moods on occasion.  These do not interfere with her function and she said that most would not realize she was experiencing it.  Plans on continuing to invest in connections.  Boys will go to [place] with their Dad for two weeks and she plans to connect with female friends more during this time.  Elected to schedule for:  June 20th at 9:00.

## 2011-11-15 ENCOUNTER — Ambulatory Visit (INDEPENDENT_AMBULATORY_CARE_PROVIDER_SITE_OTHER): Payer: BC Managed Care – PPO | Admitting: Psychology

## 2011-11-15 DIAGNOSIS — F4323 Adjustment disorder with mixed anxiety and depressed mood: Secondary | ICD-10-CM

## 2011-11-15 NOTE — Progress Notes (Signed)
Avon presents for follow-up. The boys and her husband leave for two weeks today.  She is looking forward to it but suspects she will miss the boys a good bit.  Discussed how she will fill her time.  Today is her birthday and she is treating herself to her favorite class at the gym and dinner from a favorite recipe.   She sent out several "bids" for connection for when her family is away.  She reports that she feels good about her efforts regardless of the outcome.  She will also use this time away to visit with an attorney to explore her options.  This is something she has been considering with various degrees of motivation for at least a year.  She is pretty confident she will do it and suspects she will feel empowered on the other side.    Discussed an on-going communication issue with Gabriel Rung that has her frustrated.  Provided empathy and support.

## 2011-11-15 NOTE — Assessment & Plan Note (Signed)
Report of mood is good.  Affect is consistent.  Thought content is hopeful.  Function is good.  Will follow on July 26th at 9:00 a.m.

## 2012-01-16 ENCOUNTER — Other Ambulatory Visit: Payer: Self-pay | Admitting: Obstetrics and Gynecology

## 2012-01-16 DIAGNOSIS — Z1231 Encounter for screening mammogram for malignant neoplasm of breast: Secondary | ICD-10-CM

## 2012-01-17 ENCOUNTER — Ambulatory Visit (INDEPENDENT_AMBULATORY_CARE_PROVIDER_SITE_OTHER): Payer: BC Managed Care – PPO | Admitting: Psychology

## 2012-01-17 DIAGNOSIS — F4323 Adjustment disorder with mixed anxiety and depressed mood: Secondary | ICD-10-CM

## 2012-01-17 NOTE — Progress Notes (Signed)
Allison Campos presents for follow-up.  She is in a boot secondary to some tendonitis.  This affects her ability to exercise but hopefully for only a week.  Discussed impact.  Boys are back in school.  This is easier for her.  She is feeling more vulnerable in her relationship right now.  She feels like she can't do anything right and is worried about the impact her marital dynamic has on her sons - specifically her older son who takes up the role as mediator.  She continues to set limits with her husband but has trouble doing it without anger.

## 2012-01-17 NOTE — Assessment & Plan Note (Signed)
Allison Campos's ability to tolerate the negative relationship dynamic (no positive interactions coupled with a lot of criticism and hostility) waxes and wanes.  She is struggling a lot frequently.  She is not interested in making a major change in her living situation / marital status presently so is looking more for a booster on the cognitive and behavioral strategies she can use in order to maintain a healthier sense of self.  Discussed gratitudes / appreciation including putting them on the menu for family dinners.  Even if others don't participate, she can model demonstrating appreciation.  Also revisited limit setting with her husband.  A conversation where she expresses her concerns and works with him to develop a plan is not a possibility.  Reviewed specific strategies / language.    Follow-up in one month or as needed.  She did not visit the lawyer as planned.  Finances and her family are major barriers.

## 2012-02-06 ENCOUNTER — Ambulatory Visit
Admission: RE | Admit: 2012-02-06 | Discharge: 2012-02-06 | Disposition: A | Discharge status: Home / Self Care | Payer: BC Managed Care – PPO | Source: Ambulatory Visit | Attending: Obstetrics and Gynecology | Admitting: Obstetrics and Gynecology

## 2012-02-06 DIAGNOSIS — Z1231 Encounter for screening mammogram for malignant neoplasm of breast: Secondary | ICD-10-CM

## 2012-02-14 ENCOUNTER — Ambulatory Visit (INDEPENDENT_AMBULATORY_CARE_PROVIDER_SITE_OTHER): Payer: BC Managed Care – PPO | Admitting: Psychology

## 2012-02-14 ENCOUNTER — Encounter: Payer: Self-pay | Admitting: Psychology

## 2012-02-14 DIAGNOSIS — F4323 Adjustment disorder with mixed anxiety and depressed mood: Secondary | ICD-10-CM

## 2012-02-14 NOTE — Progress Notes (Signed)
Venelope presents for follow-up.  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  This is a major shift in their family that has caused significant stress that they are attempted to manage well.

## 2012-02-14 NOTE — Assessment & Plan Note (Signed)
Report of mood is generally good with periods of feeling anxious.  Affect today is within normal limits.  Thoughts are clear, goal directed and largely positive.  The stress has presented opportunities for a different relationship dynamic that has gone fine.  Allison Campos has sought Allison Campos's opinion on a number of different things and Allison Campos is clear about her role and feels like she has done a good job fulfilling it.  Discussed stress management.  Allison Campos has had decreased appetite (which she hasn't minded secondary to wanting to lose weight) and some sleep disturbance.  Still functioning adequately.  Limit setting when Allison Campos is stressed was reviewed.  Insurance status will change at the end of September.  Allison Campos will keep in touch via email and phone and schedule as needed.

## 2012-04-18 ENCOUNTER — Ambulatory Visit (INDEPENDENT_AMBULATORY_CARE_PROVIDER_SITE_OTHER): Payer: BC Managed Care – PPO | Admitting: Internal Medicine

## 2012-04-18 DIAGNOSIS — Z23 Encounter for immunization: Secondary | ICD-10-CM

## 2012-08-11 ENCOUNTER — Ambulatory Visit (INDEPENDENT_AMBULATORY_CARE_PROVIDER_SITE_OTHER): Payer: BC Managed Care – PPO | Admitting: Family Medicine

## 2012-08-11 ENCOUNTER — Telehealth: Payer: Self-pay | Admitting: Internal Medicine

## 2012-08-11 ENCOUNTER — Encounter: Payer: Self-pay | Admitting: Family Medicine

## 2012-08-11 VITALS — BP 130/90 | Temp 98.2°F | Wt 159.0 lb

## 2012-08-11 DIAGNOSIS — B9789 Other viral agents as the cause of diseases classified elsewhere: Secondary | ICD-10-CM

## 2012-08-11 DIAGNOSIS — B349 Viral infection, unspecified: Secondary | ICD-10-CM

## 2012-08-11 MED ORDER — OSELTAMIVIR PHOSPHATE 75 MG PO CAPS: 75.0000 mg | ORAL_CAPSULE | Freq: Two times a day (BID) | ORAL | Status: DC

## 2012-08-11 NOTE — Telephone Encounter (Signed)
Patient Information:  Caller Name: Alberto  Phone: [phone]  Patient: Allison, Campos  Gender: Female  DOB: April 19, 1966  Age: 47 Years  PCP: Birdie Sons (Adults only)  Pregnant: No  Office Follow Up:  Does the office need to follow up with this patient?: No  Instructions For The Office: N/A  RN Note:  Dr. Cato Mulligan not in today, so made appt with Dr. Caryl Never per pt request.    Symptoms  Reason For Call & Symptoms: Calling today 08/11/12 regarding started with chills yesterday and temp 103 (O).  Also bodyaches.  Occasional coughing and sneezing.  Reviewed Health History In EMR: Yes  Reviewed Medications In EMR: Yes  Reviewed Allergies In EMR: Yes  Reviewed Surgeries / Procedures: Yes  Date of Onset of Symptoms: 08/10/2012  Treatments Tried: Tylenol  Treatments Tried Worked: No  Any Fever: Yes  Fever Taken: Oral  Fever Time Of Reading: 05:30:00  Fever Last Reading: 101 OB / GYN:  LMP: Unknown  Guideline(s) Used:  Influenza - Seasonal  Disposition Per Guideline:   See Today or Tomorrow in Office  Reason For Disposition Reached:   Patient wants to be seen  Advice Given:  N/A  Patient Will Follow Care Advice:  YES  Appointment Scheduled:  08/11/2012 10:30:00 Appointment Scheduled Provider:  Evelena Peat (Family Practice)

## 2012-08-11 NOTE — Patient Instructions (Addendum)
Influenza Facts  Flu (influenza) is a contagious respiratory illness caused by the influenza viruses. It can cause mild to severe illness. While most healthy people recover from the flu without specific treatment and without complications, older people, young children, and people with certain health conditions are at higher risk for serious complications from the flu, including death.  CAUSES    The flu virus is spread from person to person by respiratory droplets from coughing and sneezing.   A person can also become infected by touching an object or surface with a virus on it and then touching their mouth, eye or nose.   Adults may be able to infect others from 1 day before symptoms occur and up to 7 days after getting sick. So it is possible to give someone the flu even before you know you are sick and continue to infect others while you are sick.  SYMPTOMS    Fever (usually high).   Headache.   Tiredness (can be extreme).   Cough.   Sore throat.   Runny or stuffy nose.   Body aches.   Diarrhea and vomiting may also occur, particularly in children.   These symptoms are referred to as "flu-like symptoms". A lot of different illnesses, including the common cold, can have similar symptoms.  DIAGNOSIS    There are tests that can determine if you have the flu as long you are tested within the first 2 or 3 days of illness.   A doctor's exam and additional tests may be needed to identify if you have a disease that is a complicating the flu.  RISKS AND COMPLICATIONS   Some of the complications caused by the flu include:   Bacterial pneumonia or progressive pneumonia caused by the flu virus.   Loss of body fluids (dehydration).   Worsening of chronic medical conditions, such as heart failure, asthma, or diabetes.   Sinus problems and ear infections.  HOME CARE INSTRUCTIONS    Seek medical care early on.   If you are at high risk from complications of the flu, consult your health-care provider as soon  as you develop flu-like symptoms. Those at high risk for complications include:   People 65 years or older.   People with chronic medical conditions, including diabetes.   Pregnant women.   Young children.   Your caregiver may recommend use of an antiviral medication to help treat the flu.   If you get the flu, get plenty of rest, drink a lot of liquids, and avoid using alcohol and tobacco.   You can take over-the-counter medications to relieve the symptoms of the flu if your caregiver approves. (Never give aspirin to children or teenagers who have flu-like symptoms, particularly fever).  PREVENTION   The single best way to prevent the flu is to get a flu vaccine each fall. Other measures that can help protect against the flu are:   Antiviral Medications   A number of antiviral drugs are approved for use in preventing the flu. These are prescription medications, and a doctor should be consulted before they are used.   Habits for Good Health   Cover your nose and mouth with a tissue when you cough or sneeze, throw the tissue away after you use it.   Wash your hands often with soap and water, especially after you cough or sneeze. If you are not near water, use an alcohol-based hand cleaner.   Avoid people who are sick.   If you get the   flu, stay home from work or school. Avoid contact with other people so that you do not make them sick, too.   Try not to touch your eyes, nose, or mouth as germs ore often spread this way.  IN CHILDREN, EMERGENCY WARNING SIGNS THAT NEED URGENT MEDICAL ATTENTION:   Fast breathing or trouble breathing.   Bluish skin color.   Not drinking enough fluids.   Not waking up or not interacting.   Being so irritable that the child does not want to be held.   Flu-like symptoms improve but then return with fever and worse cough.   Fever with a rash.  IN ADULTS, EMERGENCY WARNING SIGNS THAT NEED URGENT MEDICAL ATTENTION:   Difficulty breathing or shortness of breath.   Pain  or pressure in the chest or abdomen.   Sudden dizziness.   Confusion.   Severe or persistent vomiting.  SEEK IMMEDIATE MEDICAL CARE IF:   You or someone you know is experiencing any of the symptoms above. When you arrive at the emergency center,report that you think you have the flu. You may be asked to wear a mask and/or sit in a secluded area to protect others from getting sick.  MAKE SURE YOU:    Understand these instructions.   Monitor your condition.   Seek medical care if you are getting worse, or not improving.  Document Released: 05/17/2003 Document Revised: 08/06/2011 Document Reviewed: 02/10/2009  ExitCare Patient Information 2013 [employer].

## 2012-08-11 NOTE — Progress Notes (Signed)
  Subjective:    Patient ID: Allison Campos, female    DOB: 04-Sep-1965, 47 y.o.   MRN: [id number]  HPI Acute illness Onset yesterday afternoon sudden chills, fatigue, headache,fever, aches. Mild sore throat.  No nausea. Patient is taken some Tylenol this morning which has brought down fever Denies any skin rashes. No dysuria. No abdominal pain.   Review of Systems  Constitutional: Positive for fever, chills and fatigue.  HENT: Positive for congestion.   Respiratory: Positive for cough.   Genitourinary: Negative for dysuria.  Neurological: Positive for headaches.       Objective:   Physical Exam  Constitutional:  Patient is alert nontoxic in appearance  HENT:  Head: Normocephalic and atraumatic.  Right Ear: External ear normal.  Left Ear: External ear normal.  Mouth/Throat: Oropharynx is clear and moist.  Neck: Neck supple.  Cardiovascular: Normal rate and regular rhythm.   Pulmonary/Chest: Effort normal and breath sounds normal. No respiratory distress. She has no wheezes. She has no rales.  Lymphadenopathy:    She has no cervical adenopathy.          Assessment & Plan:  Febrile illness. Suspect possible influenza. Check influenza screen. Consider Tamiflu 75 mg twice daily for 5 days

## 2012-10-10 ENCOUNTER — Encounter: Payer: Self-pay | Admitting: Gastroenterology

## 2012-10-13 ENCOUNTER — Encounter: Payer: Self-pay | Admitting: Gastroenterology

## 2012-10-13 ENCOUNTER — Ambulatory Visit (INDEPENDENT_AMBULATORY_CARE_PROVIDER_SITE_OTHER): Payer: BC Managed Care – PPO | Admitting: Nurse Practitioner

## 2012-10-13 ENCOUNTER — Encounter: Payer: Self-pay | Admitting: Nurse Practitioner

## 2012-10-13 VITALS — BP 130/70 | HR 70 | Ht 63.0 in | Wt 158.6 lb

## 2012-10-13 DIAGNOSIS — K648 Other hemorrhoids: Secondary | ICD-10-CM

## 2012-10-13 DIAGNOSIS — K625 Hemorrhage of anus and rectum: Secondary | ICD-10-CM | POA: Insufficient documentation

## 2012-10-13 MED ORDER — MOVIPREP 100 G PO SOLR: 1.0000 | Freq: Once | ORAL | Status: DC

## 2012-10-13 MED ORDER — HYDROCORTISONE ACETATE 25 MG RE SUPP: RECTAL | Status: DC

## 2012-10-13 NOTE — Patient Instructions (Addendum)
You have been given a separate informational sheet regarding your tobacco use, the importance of quitting and local resources to help you quit. We sent a presctiption for Anusol HC Suppositories to [address]. We also sent the Moviprep, colonoscopy prep. You have been scheduled for a colonoscopy with propofol. Please follow written instructions given to you at your visit today.  Please pick up your prep kit at the pharmacy within the next 1-3 days. If you use inhalers (even only as needed), please bring them with you on the day of your procedure. Your physician has requested that you go to www.startemmi.com and enter the access code given to you at your visit today. This web site gives a general overview about your procedure. However, you should still follow specific instructions given to you by our office regarding your preparation for the procedure.

## 2012-10-13 NOTE — Progress Notes (Signed)
Reviewed and agree with management plan.  Malcolm T. Stark, MD FACG 

## 2012-10-13 NOTE — Progress Notes (Signed)
HPI :  Patient is a 47 year old female, new to this practice, referred by her gynecologist for evaluation of hemoccult-positive stools. Over the last six months patient has had two episode of painless rectal bleeding with bowel movements. Bleeding not associated with constipation. Her bowel movements are normal. No other GI complaints. Her weight is stable.   Patient has a history of Graves' disease, s/p radioactive iodine uptake 2008. She stopped Synthroid a year ago, hasn't had TSH checked in a year but plans to make a follow up with Dr. Everardo All her endocrinologist.    Past Medical History  Diagnosis Date  . HYPOTHYROIDISM, POST-RADIATION 07/19/2008  . GOITER, MULTINODULAR 01/08/2008  . 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 Hyperthyroidism   . Grave's disease   . Alcoholism in family     Family History  Problem Relation Age of Onset  . Alcohol abuse Other     Family History of Alcholism/Addiction  . Arthritis Other     Family History of  . Cancer Other     Family History of Ovarian/Uterine Cancer  . Thyroid disease Other     not in immediate family  . Breast cancer Maternal Aunt   . Colon cancer Maternal Uncle    History  Substance Use Topics  . Smoking status: Current Some Day Smoker    Types: Cigarettes  . Smokeless tobacco: Never Used     Comment: Tobbacco info given 10/13/12  . Alcohol Use: Yes   Current Outpatient Prescriptions  Medication Sig Dispense Refill  . Calcium-Vitamin D-Vitamin K (VIACTIV) 500-100-40 MG-UNT-MCG CHEW Chew 1 tablet by mouth daily.        . cetirizine (ZYRTEC) 10 MG tablet Take 10 mg by mouth daily as needed.         No current facility-administered medications for this visit.   No Known Allergies  Review of Systems: Positive for sinus / allergies. All other systems reviewed and negative except where noted in HPI.   Physical Exam: BP 130/70  Pulse 70  Ht 5\' 3"  (1.6 m)  Wt 158 lb 9.6 oz (71.94 kg)  BMI 28.1  kg/m2 Constitutional: Peasant,well-developed, white female in no acute distress. HEENT: Normocephalic and atraumatic. Conjunctivae are normal. No scleral icterus. Neck supple.  Cardiovascular: Normal rate, regular rhythm.  Pulmonary/chest: Effort normal and breath sounds normal. No wheezing, rales or rhonchi. Abdominal: Soft, nondistended, nontender. Bowel sounds active throughout. There are no masses palpable. No hepatomegaly. Rectal: Small mildly inflamed internal hemorrhoids on anoscopy Extremities: no edema Lymphadenopathy: No cervical adenopathy noted. Neurological: Alert and oriented to person place and time. Skin: Skin is warm and dry. No rashes noted. Psychiatric: Normal mood and affect. Behavior is normal.   ASSESSMENT AND PLAN: 75.  47 year old female with 2 episodes of rectal bleeding over last six months and heme positive stools at GYN's office recently.Suspect bleeding secondary to internal hemorrhoids but it is reasonable to proceed with a colonoscopy for further evaluation. The risks, benefits, and alternatives to colonoscopy with possible biopsy and possible polypectomy were discussed with the patient and she consents to proceed.   2. Internal hemorrhoids, small. Will treat with Anusol HC suppositories for 7 days.   3. Hx of Graves disease, s/p radioactive iodine therapy. Patient discontinued her Synthroid last year. She plans to make a follow up appointment with her endocrinologist.

## 2012-10-15 ENCOUNTER — Telehealth: Payer: Self-pay | Admitting: Nurse Practitioner

## 2012-10-15 DIAGNOSIS — E0500 Thyrotoxicosis with diffuse goiter without thyrotoxic crisis or storm: Secondary | ICD-10-CM

## 2012-10-15 NOTE — Telephone Encounter (Signed)
Patient wants to have labs that Willette Cluster, NP talked about. Please, advise

## 2012-10-16 NOTE — Telephone Encounter (Signed)
Per Willette Cluster, NP order TSH.

## 2012-10-16 NOTE — Telephone Encounter (Signed)
Patient aware. Lab in EPIC. 

## 2012-10-17 ENCOUNTER — Other Ambulatory Visit (INDEPENDENT_AMBULATORY_CARE_PROVIDER_SITE_OTHER): Payer: BC Managed Care – PPO

## 2012-10-17 DIAGNOSIS — E0500 Thyrotoxicosis with diffuse goiter without thyrotoxic crisis or storm: Secondary | ICD-10-CM

## 2012-10-21 ENCOUNTER — Encounter: Payer: Self-pay | Admitting: Gastroenterology

## 2012-10-21 ENCOUNTER — Ambulatory Visit (AMBULATORY_SURGERY_CENTER): Payer: BC Managed Care – PPO | Admitting: Gastroenterology

## 2012-10-21 VITALS — BP 133/62 | HR 57 | Temp 97.1°F | Resp 19 | Ht 63.0 in | Wt 158.0 lb

## 2012-10-21 DIAGNOSIS — R195 Other fecal abnormalities: Secondary | ICD-10-CM

## 2012-10-21 DIAGNOSIS — D126 Benign neoplasm of colon, unspecified: Secondary | ICD-10-CM

## 2012-10-21 DIAGNOSIS — K648 Other hemorrhoids: Secondary | ICD-10-CM

## 2012-10-21 DIAGNOSIS — K625 Hemorrhage of anus and rectum: Secondary | ICD-10-CM

## 2012-10-21 MED ORDER — SODIUM CHLORIDE 0.9 % IV SOLN: 500.0000 mL | INTRAVENOUS | Status: DC

## 2012-10-21 NOTE — Progress Notes (Signed)
Called to room to assist during endoscopic procedure.  Patient ID and intended procedure confirmed with present staff. Received instructions for my participation in the procedure from the performing physician.  

## 2012-10-21 NOTE — Op Note (Signed)
[facility] [address] [place], [address]   COLONOSCOPY PROCEDURE REPORT  PATIENT: Allison Campos, Allison Campos  MR#: [id number] BIRTHDATE: 1965/12/28 , 46  yrs. old GENDER: Female ENDOSCOPIST: Meryl Dare, MD, [facility] REFERRED VO:ZDGU Maurice [place], [place] PROCEDURE DATE:  10/21/2012 PROCEDURE:   Colonoscopy with snare polypectomy ASA CLASS:   Class II INDICATIONS:hematochezia. MEDICATIONS: MAC sedation, administered by CRNA and propofol (Diprivan) 300mg  IV DESCRIPTION OF PROCEDURE:   After the risks benefits and alternatives of the procedure were thoroughly explained, informed consent was obtained.  A digital rectal exam revealed no abnormalities of the rectum.   The LB [id number]  endoscope was introduced through the anus and advanced to the cecum, which was identified by both the appendix and ileocecal valve. No adverse events experienced.   The quality of the prep was excellent, using MoviPrep.  The instrument was then slowly withdrawn as the colon was fully examined.  COLON FINDINGS: A sessile polyp measuring 5 mm in size was found in the transverse colon.  A polypectomy was performed with a cold snare.  The resection was complete and the polyp tissue was completely retrieved.   The colon was otherwise normal.  There was no diverticulosis, inflammation, polyps or cancers unless previously stated.  Retroflexed views revealed small internal hemorrhoids. The time to cecum=3 minutes 06 seconds. Withdrawal time=9 minutes 03 seconds. The scope was withdrawn and the procedure completed.  COMPLICATIONS: There were no complications.  ENDOSCOPIC IMPRESSION: 1.   Sessile polyp measuring 5 mm in the transverse colon; polypectomy performed with a cold snare 2.   Small internal hemorrhoids  RECOMMENDATIONS: 1.  Await pathology results 2.  Repeat colonoscopy in 5 years if polyp adenomatous; otherwise 10 years  eSigned:  Meryl Dare, MD, [facility] 10/21/2012 4:20  PM   [

## 2012-10-21 NOTE — Progress Notes (Signed)
No egg or soy allergy. ewm 

## 2012-10-21 NOTE — Progress Notes (Signed)
Patient did not experience any of the following events: a burn prior to discharge; a fall within the facility; wrong site/side/patient/procedure/implant event; or a hospital transfer or hospital admission upon discharge from the facility. (G8907) Patient did not have preoperative order for IV antibiotic SSI prophylaxis. ([id number])  

## 2012-10-21 NOTE — Patient Instructions (Addendum)

## 2012-10-21 NOTE — Progress Notes (Signed)
Lidocaine-40mg IV prior to Propofol InductionPropofol given over incremental dosages 

## 2012-10-22 ENCOUNTER — Telehealth: Payer: Self-pay | Admitting: *Deleted

## 2012-10-22 NOTE — Telephone Encounter (Signed)
Different name on machine but matches other contact on account, left message, follow-up

## 2012-10-27 ENCOUNTER — Encounter: Payer: Self-pay | Admitting: Gastroenterology

## 2012-10-28 ENCOUNTER — Ambulatory Visit: Payer: BC Managed Care – HMO | Admitting: Gastroenterology

## 2012-11-25 ENCOUNTER — Ambulatory Visit (INDEPENDENT_AMBULATORY_CARE_PROVIDER_SITE_OTHER): Payer: BC Managed Care – PPO | Admitting: Psychology

## 2012-11-25 DIAGNOSIS — F4323 Adjustment disorder with mixed anxiety and depressed mood: Secondary | ICD-10-CM

## 2012-11-25 NOTE — Assessment & Plan Note (Signed)
Report of mood is sad.  Affect is consistent.  Feelings of worthlessness are present.  She still enjoys things like working out, her work and activities with the boys.  Discussed options including:  Accept the situation as it is, change it or reframe it.  Laid out pros and cons of each.  Discussed meditation as a way to cope with physiological and emotional arousal that she feels.  She has practice in this with some effect.  Will follow again on July 17th to see if things have shifted.  This is a period of transition so it is possible things will settle down again.

## 2012-11-25 NOTE — Progress Notes (Signed)
Allison Campos presents for her first therapy appointment since September of last year.  She has had a more difficult time recently as the chronic stressors are present still and now that school is out, her husband is home and unemployed.  Sergio works from home making it more of a challenge.  She feels like she is walking on egg shells and is having significantly distressing thoughts about herself.  No SI or HI.  Has met with a Clinical research associate.  Would like to leave but finances are a major barrier.  Also - wonders about ipact on boys but thinks ultimately, they would be okay with her decision.

## 2012-12-08 ENCOUNTER — Ambulatory Visit (INDEPENDENT_AMBULATORY_CARE_PROVIDER_SITE_OTHER): Payer: BC Managed Care – PPO | Admitting: Endocrinology

## 2012-12-08 ENCOUNTER — Encounter: Payer: Self-pay | Admitting: Endocrinology

## 2012-12-08 VITALS — BP 146/80 | HR 72 | Ht 64.0 in | Wt 159.0 lb

## 2012-12-08 DIAGNOSIS — E042 Nontoxic multinodular goiter: Secondary | ICD-10-CM

## 2012-12-08 MED ORDER — LEVOTHYROXINE SODIUM 50 MCG PO TABS: 50.0000 ug | ORAL_TABLET | Freq: Every day | ORAL | Status: DC

## 2012-12-08 NOTE — Progress Notes (Signed)
  Subjective:    Patient ID: Allison Campos, female    DOB: [birth year], 47 y.o.   MRN: [id number]  HPI In 2008, pt had i-131 rx for hyperthyroidism, due to a multinodular goiter.  She takes synthroid 50 mcg intermittently.  she does not notice the goiter.  pt states she feels well in general. Past Medical History  Diagnosis Date  . HYPOTHYROIDISM, POST-RADIATION 07/19/2008  . GOITER, MULTINODULAR 01/08/2008  . 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 Hyperthyroidism   . Grave's disease   . Alcoholism in family     Past Surgical History  Procedure Laterality Date  . Shoulder arthroscopy      left  . Laparoscopy      Endometriosis  . Cesarean section      x2  . Foot surgery      to remove sewing needle    History   Social History  . Marital Status: Married    Spouse Name: N/A    Number of Children: 2  . Years of Education: N/A   Occupational History  . CPA    Social History Main Topics  . Smoking status: Current Some Day Smoker    Types: Cigarettes  . Smokeless tobacco: Never Used     Comment: Tobbacco info given 10/13/12-smokes 4 a week  . Alcohol Use: 3.0 oz/week    5 Glasses of wine per week  . Drug Use: No  . Sexually Active: Not on file   Other Topics Concern  . Not on file   Social History Narrative   Regular exercise-yes    Current Outpatient Prescriptions on File Prior to Visit  Medication Sig Dispense Refill  . Calcium-Vitamin D-Vitamin K (VIACTIV) 500-100-40 MG-UNT-MCG CHEW Chew 1 tablet by mouth daily.        . cetirizine (ZYRTEC) 10 MG tablet Take 10 mg by mouth daily as needed.        . hydrocortisone (ANUSOL-HC) 25 MG suppository Use 1 suppository at bedtime  7 suppository  1   No current facility-administered medications on file prior to visit.    No Known Allergies  Family History  Problem Relation Age of Onset  . Alcohol abuse Other     Family History of Alcholism/Addiction  . Arthritis Other     Family History of  . Cancer  Other     Family History of Ovarian/Uterine Cancer  . Thyroid disease Other     not in immediate family  . Breast cancer Maternal Aunt   . Colon cancer Maternal Uncle     BP 146/80  Pulse 72  Ht 5\' 4"  (1.626 m)  Wt 159 lb (72.122 kg)  BMI 27.28 kg/m2  SpO2 96%  Review of Systems Denies weight change and neck pain    Objective:   Physical Exam VITAL SIGNS:  See vs page GENERAL: no distress NECK: there is a 1-2 cm left thyroid nodule.    Lab Results  Component Value Date   TSH 6.62* 10/17/2012      Assessment & Plan:  Post-i-131 hypothyroidism, on synthroid Multinodular goiter, clinically unchanged

## 2012-12-08 NOTE — Patient Instructions (Addendum)
Take levothyroxine 50 mcg/day, as prescribed.  i have sent a prescription to your pharmacy. Let's recheck the ultrasound.  you will receive a phone call, about a day and time for an appointment.  We'll contact you with results. Please return in 1 year.

## 2012-12-11 ENCOUNTER — Ambulatory Visit (INDEPENDENT_AMBULATORY_CARE_PROVIDER_SITE_OTHER): Payer: BC Managed Care – PPO | Admitting: Psychology

## 2012-12-22 ENCOUNTER — Ambulatory Visit
Admission: RE | Admit: 2012-12-22 | Discharge: 2012-12-22 | Disposition: A | Discharge status: Home / Self Care | Payer: BC Managed Care – PPO | Source: Ambulatory Visit | Attending: Endocrinology | Admitting: Endocrinology

## 2013-01-01 ENCOUNTER — Ambulatory Visit (INDEPENDENT_AMBULATORY_CARE_PROVIDER_SITE_OTHER): Payer: BC Managed Care – PPO | Admitting: Psychology

## 2013-01-01 DIAGNOSIS — F4323 Adjustment disorder with mixed anxiety and depressed mood: Secondary | ICD-10-CM

## 2013-01-01 NOTE — Assessment & Plan Note (Signed)
Report of mood is improved.  She appears less stressed.  She has a plan both long-term and short-term.  Meditation should prove helpful in modulating her emotional reactivity to a situation that at times is out of her control.  She also plans on strengthening connections and engaging in activities that help make her a more well-rounded individual.    Elected to schedule in one month.

## 2013-01-01 NOTE — Progress Notes (Signed)
Allison Campos presents for follow-up.  She has continued to think about the state of her marriage and how best to manage her own well being with the well-being of her family.

## 2013-01-06 ENCOUNTER — Other Ambulatory Visit: Payer: Self-pay

## 2013-01-06 DIAGNOSIS — Z1231 Encounter for screening mammogram for malignant neoplasm of breast: Secondary | ICD-10-CM

## 2013-01-23 ENCOUNTER — Telehealth: Payer: Self-pay | Admitting: Psychology

## 2013-01-23 NOTE — Telephone Encounter (Signed)
Allison Campos emailed to cancel her appointment next Tuesday.  She reports she is doing well and will contact me as needed.

## 2013-01-27 ENCOUNTER — Ambulatory Visit: Payer: BC Managed Care – PPO | Admitting: Psychology

## 2013-02-06 ENCOUNTER — Ambulatory Visit: Payer: BC Managed Care – PPO

## 2013-03-05 ENCOUNTER — Ambulatory Visit: Payer: BC Managed Care – PPO

## 2013-04-02 ENCOUNTER — Ambulatory Visit: Payer: BC Managed Care – PPO

## 2013-04-10 ENCOUNTER — Ambulatory Visit (INDEPENDENT_AMBULATORY_CARE_PROVIDER_SITE_OTHER): Payer: BC Managed Care – PPO | Admitting: Psychology

## 2013-04-10 DIAGNOSIS — F4323 Adjustment disorder with mixed anxiety and depressed mood: Secondary | ICD-10-CM

## 2013-04-10 NOTE — Assessment & Plan Note (Signed)
Allison Campos had done a lot of work before she hit the door and seemed to need a sounding board.  Discussed specific strategies like a gratitude journal and redefining strong vs. weak.   She is also going to try to invest in fulfilling activities outside the home including volunteering at the animal shelter - something she has done in the past.  She will call as needed.

## 2013-04-10 NOTE — Progress Notes (Signed)
Allison Campos presents for follow-up.  She continues on her journey of trying to balance what is best for her and for her children with regards to her marriage.  She recently had a conversation with her husband where she was more fully honest about her concerns.  This did not go well and yet she thinks it was important for him to know.  She has developed a Clinical research associate (or story) whereby she will view this marriage as a business relationship and will retain a sense of power by working on keeping herself healthy.  Discussed where to go from here.

## 2013-04-29 ENCOUNTER — Ambulatory Visit: Payer: BC Managed Care – PPO

## 2013-05-05 ENCOUNTER — Ambulatory Visit: Payer: BC Managed Care – PPO

## 2013-05-06 ENCOUNTER — Ambulatory Visit (INDEPENDENT_AMBULATORY_CARE_PROVIDER_SITE_OTHER): Payer: BC Managed Care – PPO | Admitting: Psychology

## 2013-05-06 DIAGNOSIS — F4323 Adjustment disorder with mixed anxiety and depressed mood: Secondary | ICD-10-CM

## 2013-05-06 NOTE — Progress Notes (Signed)
Kalianna presents for follow-up.  She continues to find things that make her question what she should do about her marriage.  She describes herself as being in a great deal of "turmoil" right now.  Discussed at length including the pros and cons of leaving and some specific strategies to help attenuate some of the emotional fall-out.

## 2013-05-06 NOTE — Assessment & Plan Note (Signed)
Report of mood is poor.  She appears anxious and more sad than typical.  There is a great deal of confusion and indecision.  Nothing feels right.  She does agree that some of her behaviors (over which she has control) are not likely the healthiest and that making changes in this regard would likely be useful.  We looked at a time frame for suspending a certain behavior to see if she noticed a different in her health.  We also discussed the use of counseling as a way to put more of her cards on the table and (as a long shot) reach a consensus as to how to proceed.    She will call as needed to schedule.

## 2013-05-12 ENCOUNTER — Ambulatory Visit
Admission: RE | Admit: 2013-05-12 | Discharge: 2013-05-12 | Disposition: A | Discharge status: Home / Self Care | Payer: BC Managed Care – PPO | Source: Ambulatory Visit

## 2013-05-12 DIAGNOSIS — Z1231 Encounter for screening mammogram for malignant neoplasm of breast: Secondary | ICD-10-CM

## 2013-06-19 ENCOUNTER — Telehealth: Payer: Self-pay | Admitting: Internal Medicine

## 2013-06-19 MED ORDER — OSELTAMIVIR PHOSPHATE 75 MG PO CAPS: 75.0000 mg | ORAL_CAPSULE | Freq: Two times a day (BID) | ORAL | Status: DC

## 2013-06-19 NOTE — Telephone Encounter (Signed)
Per Dr Leanne Chang ok to call in Tamiflu 75 mg bid x7 days, rx sent electronically, pt aware

## 2013-06-19 NOTE — Telephone Encounter (Signed)
Patient Information:  Caller Name: Shatana  Phone: [phone]  Patient: Allison Campos, Allison Campos  Gender: Female  DOB: 1966-01-05  Age: 48 Years  PCP: Phoebe Sharps (Adults only)  Pregnant: No  Office Follow Up:  Does the office need to follow up with this patient?: Yes  Instructions For The Office: Request for Tamiflu  RN Note:  Kenton Kingfisher [phone];  Ibuprofen adult dose 400-800 mg Q 6-8 hrs reviewed.  Symptoms  Reason For Call & Symptoms: Son treated for Flu after being seen in the u/c 06/17/13;  S/S onset 06/18/13 with ha, temp 101 F o; Cough, aches, ha and temp 102 F.  Reviewed Health History In EMR: Yes  Reviewed Medications In EMR: Yes  Reviewed Allergies In EMR: Yes  Reviewed Surgeries / Procedures: Yes  Date of Onset of Symptoms: 06/18/2013  Treatments Tried: Ibuprofen 400 mg  Treatments Tried Worked: Yes  Any Fever: Yes  Fever Taken: Oral  Fever Time Of Reading: 00:00:00  Fever Last Reading: 7 OB / GYN:  LMP: Unknown  Guideline(s) Used:  Influenza - Seasonal  Disposition Per Guideline:   Discuss with PCP and Callback by Nurse Today  Reason For Disposition Reached:   Patient requests antiviral medicine for influenza and flu symptoms present < 48 hours  Advice Given:  Reassurance  For most healthy adults, influenza feels like a bad cold. The dangers of influenza for normal, healthy people (under 36 years of age) are overrated.  The treatment of influenza depends on your main symptoms. Generally, treatment is the same as for other viral respiratory infections (colds). Bed rest is unnecessary.  Here is some care advice that should help.  Treating the Symptoms of Flu  Fever, Muscle Aches, and Headache: For fever more than 101 F (38.3 C), muscle aches, and headaches, take acetaminophen every 4-6 hours (Adults 650 mg) OR ibuprofen every 6-8 hours (Adults 400-600 mg).  Sore Throat: Use throat lozenges, hard candy or warm chicken broth.  Hydrate: Drink extra liquids.  If the air in your home is dry, use a humidifier.  Expected Course  : The fever lasts 2-3 days, the runny nose 5-10 days, and the cough 2-3 weeks.  Call Back If:  Fever lasts more than 3 days  Runny nose lasts more than 10 days  Cough lasts more than 3 weeks  You become short of breath or worse.  Coughing Spasms  Drink warm fluids. Inhale warm mist (Reason: both relax the airway and loosen up the phlegm).  Pain and Fever Medicines:  Ibuprofen (e.g., Motrin, Advil):  Another choice is to take 600 mg (three 200 mg pills) by mouth every 8 hours.  Patient Will Follow Care Advice:  YES

## 2013-11-23 ENCOUNTER — Ambulatory Visit (INDEPENDENT_AMBULATORY_CARE_PROVIDER_SITE_OTHER): Payer: BC Managed Care – PPO | Admitting: Psychology

## 2013-11-23 DIAGNOSIS — F4323 Adjustment disorder with mixed anxiety and depressed mood: Secondary | ICD-10-CM

## 2013-11-23 NOTE — Assessment & Plan Note (Addendum)
Report of mood is anxious.  She is concerned about some of her behaviors and the stress tipping her to an unhealthy place.  I would not diagnose her with panic disorder as these "attacks" seem triggered.  Denies a history of panic.  Does not appear to have developed anticipatory anxiety.  Would continue to look at this as an adjustment to a very stressful situation.  Denied any SI / HI.  She seems to recognize that she can not maintain this high of a level of stress for much longer.  Looked at cognitive strategies in the moment - specifically divorcing herself from being a victim.  Asked for closer follow-up and she agreed.  Will continue to monitor weight.  Provided a quick visual for moments when she has another attack (a breathing technique).  She is to call as needed.    Noted it has been over a year since TSH was drawn (I think).  I doubt this is playing a significant role but will contact the patient to let her know this may need to be checked.

## 2013-11-23 NOTE — Progress Notes (Signed)
Things have gotten more stressful for [facility] with regards to her marital relationship.  The stress is manifesting (at least in part) in panic attacks which she describes as lasting 10-15 minutes.  Voluntarily reported symptoms include:  Hyperventilating (SOB) and catastrophic thoughts.  She also endorses rapid heart rate.  She denies other symtpoms including light-headedness, dizziness, numbness or tingling, changes in vision or diaphoresis.  She has lost about 5 pounds in two weeks secondary to a nervous stomach.  She reports she is still functioning at work although panic made it difficult one day.  Still functioning as a parent.  Continues to exercise.

## 2013-12-01 ENCOUNTER — Other Ambulatory Visit: Payer: Self-pay | Admitting: Endocrinology

## 2013-12-01 ENCOUNTER — Other Ambulatory Visit: Payer: Self-pay

## 2013-12-01 ENCOUNTER — Other Ambulatory Visit: Payer: BC Managed Care – PPO

## 2013-12-01 ENCOUNTER — Ambulatory Visit (INDEPENDENT_AMBULATORY_CARE_PROVIDER_SITE_OTHER): Payer: BC Managed Care – PPO | Admitting: Psychology

## 2013-12-01 DIAGNOSIS — E042 Nontoxic multinodular goiter: Secondary | ICD-10-CM

## 2013-12-01 DIAGNOSIS — F4323 Adjustment disorder with mixed anxiety and depressed mood: Secondary | ICD-10-CM

## 2013-12-01 NOTE — Assessment & Plan Note (Signed)
Report of mood is anxious and likely will stay that way for a little while. Looked at potential outcomes and how she would feel / behave on the other side.  She has more positive self-talk than she has had in the past.  This may shift depending on what she finds out.    She is planning to get her TSH drawn and follow-up with her physician.  She thinks this might be playing a role albeit a minor one.    Scheduled for Monday the 13th at 9:00.  She knows to call or email prior to.

## 2013-12-01 NOTE — Progress Notes (Signed)
Allison Campos presents for follow-up.  She has a plan to get some information this week that may help guide her.  She is feeling nervous about it.  Denies further anxiety attacks stating that she feels more in control now.  Took a risk with a girlfriend and confided in her.  She feels good about this.  Has been giving herself permission to do fun things including go out to dinner with girlfriends and volunteer for two organizations.

## 2013-12-02 LAB — T4, FREE: FREE T4: 1.08 ng/dL (ref 0.80–1.80)

## 2013-12-02 LAB — TSH: TSH: 3.209 u[IU]/mL (ref 0.350–4.500)

## 2013-12-04 ENCOUNTER — Telehealth: Payer: Self-pay

## 2013-12-04 NOTE — Telephone Encounter (Signed)
Pt advised.

## 2013-12-04 NOTE — Telephone Encounter (Signed)
Normal i'll see you next week

## 2013-12-04 NOTE — Telephone Encounter (Signed)
Pt called requesting lab results from 7/72015. Could you review and please advise, Thanks!

## 2013-12-07 ENCOUNTER — Ambulatory Visit (INDEPENDENT_AMBULATORY_CARE_PROVIDER_SITE_OTHER): Payer: BC Managed Care – PPO | Admitting: Psychology

## 2013-12-07 DIAGNOSIS — F4323 Adjustment disorder with mixed anxiety and depressed mood: Secondary | ICD-10-CM

## 2013-12-07 NOTE — Assessment & Plan Note (Addendum)
Had difficulty reporting on mood.  Affect is within normal limits.  Thoughts are clear and goal directed.  We continued to look at warning signs that she would need to [facility] a change in order to preserve a sense of self.  This is a moving target.  She has a wedding anniversary in September.  She reported her TSH came back normal.    Elected to have her follow-up by email and we can schedule an appointment as needed.

## 2013-12-07 NOTE — Progress Notes (Signed)
Allison Campos presents for follow-up.  She did not get information she was expecting to get and is feeling reasonably okay about it.  She has a new plan to help manage the emotional difficulty she experiences most days.  She had some good social contacts this past weekend and is feeling filled up with female friendships as well as involvement in her sons' lives.

## 2013-12-10 ENCOUNTER — Ambulatory Visit (INDEPENDENT_AMBULATORY_CARE_PROVIDER_SITE_OTHER): Payer: BC Managed Care – PPO | Admitting: Endocrinology

## 2013-12-10 VITALS — BP 116/84 | HR 66 | Temp 98.1°F | Ht 64.0 in | Wt 150.0 lb

## 2013-12-10 DIAGNOSIS — E890 Postprocedural hypothyroidism: Secondary | ICD-10-CM

## 2013-12-10 MED ORDER — LEVOTHYROXINE SODIUM 50 MCG PO TABS: 50.0000 ug | ORAL_TABLET | Freq: Every day | ORAL | Status: DC

## 2013-12-10 NOTE — Progress Notes (Signed)
Subjective:    Patient ID: Allison Campos, female    DOB: 1965-08-04, 48 y.o.   MRN: [id number]  HPI In 2008, pt had i-131 rx for hyperthyroidism, due to a multinodular goiter.  [place] in 2014 showed minimal change compared to 2008; She takes synthroid 50 mcg intermittently.  she does not notice the goiter.  pt states she feels well in general. Past Medical History  Diagnosis Date  . HYPOTHYROIDISM, POST-RADIATION 07/19/2008  . GOITER, MULTINODULAR 01/08/2008  . 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 Hyperthyroidism   . Grave's disease   . Alcoholism in family     Past Surgical History  Procedure Laterality Date  . Shoulder arthroscopy      left  . Laparoscopy      Endometriosis  . Cesarean section      x2  . Foot surgery      to remove sewing needle    History   Social History  . Marital Status: Married    Spouse Name: N/A    Number of Children: 2  . Years of Education: N/A   Occupational History  . CPA    Social History Main Topics  . Smoking status: Current Some Day Smoker    Types: Cigarettes  . Smokeless tobacco: Never Used     Comment: Tobbacco info given 10/13/12-smokes 4 a week  . Alcohol Use: 3.0 oz/week    5 Glasses of wine per week  . Drug Use: No  . Sexual Activity: Not on file   Other Topics Concern  . Not on file   Social History Narrative   Regular exercise-yes    Current Outpatient Prescriptions on File Prior to Visit  Medication Sig Dispense Refill  . Calcium-Vitamin D-Vitamin K (VIACTIV) 937-902-40 MG-UNT-MCG CHEW Chew 1 tablet by mouth daily.        . cetirizine (ZYRTEC) 10 MG tablet Take 10 mg by mouth daily as needed.        . hydrocortisone (ANUSOL-HC) 25 MG suppository Use 1 suppository at bedtime  7 suppository  1  . oseltamivir (TAMIFLU) 75 MG capsule Take 1 capsule (75 mg total) by mouth 2 (two) times daily.  14 capsule  0   No current facility-administered medications on file prior to visit.    No Known  Allergies  Family History  Problem Relation Age of Onset  . Alcohol abuse Other     Family History of Alcholism/Addiction  . Arthritis Other     Family History of  . Cancer Other     Family History of Ovarian/Uterine Cancer  . Thyroid disease Other     not in immediate family  . Breast cancer Maternal Aunt   . Colon cancer Maternal Uncle     BP 116/84  Pulse 66  Temp(Src) 98.1 F (36.7 C) (Oral)  Ht 5\' 4"  (1.626 m)  Wt 150 lb (68.04 kg)  BMI 25.73 kg/m2  SpO2 97%  Review of Systems She has fatigue.  She has lost weight, due to her efforts.    Objective:   Physical Exam VITAL SIGNS:  See vs page GENERAL: no distress NECK: slight swelling at the left thyroid lobe, but i can't determine any nodule for sure.     Lab Results  Component Value Date   TSH 3.209 12/01/2013      Assessment & Plan:  Hypothyroidism: well-replaced.  However, she is at risk for recurrence of the hyperhtyoidism.  She could develop need for an increased dosage  of synthroid, but this is less likely.   Multinodular goiter: clinically unchanged.  Patient is advised the following: Patient Instructions  Please continue levothyroxine, 50 mcg/day.  i have sent a prescription to your pharmacy.   Please return in 1 year.

## 2013-12-10 NOTE — Patient Instructions (Addendum)
Please continue levothyroxine, 50 mcg/day.  i have sent a prescription to your pharmacy.   Please return in 1 year.

## 2014-06-30 ENCOUNTER — Other Ambulatory Visit: Payer: Self-pay

## 2014-06-30 DIAGNOSIS — Z1231 Encounter for screening mammogram for malignant neoplasm of breast: Secondary | ICD-10-CM

## 2014-07-07 ENCOUNTER — Ambulatory Visit
Admission: RE | Admit: 2014-07-07 | Discharge: 2014-07-07 | Disposition: A | Discharge status: Home / Self Care | Payer: BLUE CROSS/BLUE SHIELD | Source: Ambulatory Visit

## 2014-07-07 ENCOUNTER — Encounter (INDEPENDENT_AMBULATORY_CARE_PROVIDER_SITE_OTHER): Payer: Self-pay

## 2014-07-07 DIAGNOSIS — Z1231 Encounter for screening mammogram for malignant neoplasm of breast: Secondary | ICD-10-CM

## 2014-07-19 ENCOUNTER — Ambulatory Visit (INDEPENDENT_AMBULATORY_CARE_PROVIDER_SITE_OTHER): Payer: BLUE CROSS/BLUE SHIELD | Admitting: Psychology

## 2014-07-19 DIAGNOSIS — F4323 Adjustment disorder with mixed anxiety and depressed mood: Secondary | ICD-10-CM | POA: Diagnosis not present

## 2014-07-19 NOTE — Progress Notes (Signed)
Allison Campos presents for follow-up.  Last seen 11/2013.  She requested an appointment for some guidance around a long-standing family issue.  She is concerned that the marital dynamics and some individual factors are negatively impacting her children.  Her younger son is reporting symptoms of anxiety and is engaging in some concerning behaviors.  Discussed.

## 2014-07-19 NOTE — Assessment & Plan Note (Signed)
Report of mood remains poor in certain situations.  This does not, nor has it ever seemed to be pervasive.  She is able to experience joy and is motivated to function at an average or above average level in various aspects of her life.  She continues to exercise regularly which is likely the best thing she can do to manage the chronic stress she experiences in her marital relationship.  She continues to have a plan for the future.  Discussed at length.  She has already taken her son to a psychologist that did not prove to be a good match.  Has an appointment with a second one this Friday.  She will touch base as needed.  She is trying to keep her spending in check and appears to do well with occasional appointments to test out her thinking.

## 2014-09-29 ENCOUNTER — Ambulatory Visit (INDEPENDENT_AMBULATORY_CARE_PROVIDER_SITE_OTHER): Payer: BLUE CROSS/BLUE SHIELD | Admitting: Family Medicine

## 2014-09-29 ENCOUNTER — Encounter: Payer: Self-pay | Admitting: Family Medicine

## 2014-09-29 VITALS — BP 110/72 | HR 69 | Temp 98.0°F | Ht 64.0 in | Wt 158.0 lb

## 2014-09-29 DIAGNOSIS — Z860101 Personal history of adenomatous and serrated colon polyps: Secondary | ICD-10-CM | POA: Insufficient documentation

## 2014-09-29 DIAGNOSIS — Z87891 Personal history of nicotine dependence: Secondary | ICD-10-CM | POA: Insufficient documentation

## 2014-09-29 DIAGNOSIS — M545 Low back pain, unspecified: Secondary | ICD-10-CM | POA: Insufficient documentation

## 2014-09-29 DIAGNOSIS — E038 Other specified hypothyroidism: Secondary | ICD-10-CM

## 2014-09-29 DIAGNOSIS — Z23 Encounter for immunization: Secondary | ICD-10-CM | POA: Diagnosis not present

## 2014-09-29 DIAGNOSIS — E042 Nontoxic multinodular goiter: Secondary | ICD-10-CM | POA: Diagnosis not present

## 2014-09-29 DIAGNOSIS — K648 Other hemorrhoids: Secondary | ICD-10-CM | POA: Diagnosis not present

## 2014-09-29 DIAGNOSIS — Z86010 Personal history of colonic polyps: Secondary | ICD-10-CM | POA: Insufficient documentation

## 2014-09-29 NOTE — Assessment & Plan Note (Signed)
S:Graves disease found as goiter, within 4 months had quick increase in TSH. In 2008, pt had radioactive iodine for hyperthyroidism, due to a multinodular goiter. [place] in 2014 showed minimal change compared to 2008; She used to take synthroid 50 mcg  But has not taken since November.  A/P: we will check TSH and T4 today. She requests that I continue to monitor levels of TSH and T4 and she will monitor symptoms and return to Dr. Loanne Drilling of endocrine only if abnormalities. Dr. Cordelia Pen notes suggest he thinks it is likely she will become hyperthyroid again so I will ask his opinion before agreeing to assuming care. Alternatively, consider seeing him every 2-3 years so she can remain established in case there is a change.

## 2014-09-29 NOTE — Progress Notes (Signed)
Allison Reddish, MD Phone: [phone]  Subjective:  Patient presents today to establish care with me as their new primary care provider. Patient was formerly a patient of Dr. Leanne Campos. Chief complaint-noted.   See problem oriented charting ROS- no diarrhea, hot intolerance, hair or nail changes  The following were reviewed and entered/updated in epic: Past Medical History  Diagnosis Date  . HYPOTHYROIDISM, POST-RADIATION 07/19/2008  . GOITER, MULTINODULAR 01/08/2008  . 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 Hyperthyroidism   . Grave's disease   . Alcoholism in family    Patient Active Problem List   Diagnosis Date Noted  . Hypothyroidism after radiation 07/19/2008    Priority: Medium  . GOITER, MULTINODULAR 01/08/2008    Priority: Medium  . Hx of adenomatous colonic polyps 09/29/2014    Priority: Low  . Low back pain 09/29/2014    Priority: Low  . Former smoker 09/29/2014    Priority: Low  . Internal hemorrhoids 10/13/2012    Priority: Low  . ADJUSTMENT DISORDER WITH MIXED FEATURES 10/24/2006    Priority: Low   Past Surgical History  Procedure Laterality Date  . Shoulder arthroscopy      left  . Laparoscopy      Endometriosis  . Cesarean section      x2  . Foot surgery      to remove sewing needle    Family History  Problem Relation Age of Onset  . Alcohol abuse Father   . Arthritis Mother   . Uterine cancer Mother   . Thyroid disease Other     great grandmother-graves  . Breast cancer Maternal Aunt     mets to brain  . Colon cancer Maternal Uncle     Medications- reviewed and updated Current Outpatient Prescriptions  Medication Sig Dispense Refill  . Calcium-Vitamin D-Vitamin K (VIACTIV) [id number] MG-UNT-MCG CHEW Chew 1 tablet by mouth daily.      . cetirizine (ZYRTEC) 10 MG tablet Take 10 mg by mouth daily as needed.      [name] levothyroxine (SYNTHROID, LEVOTHROID) 50 MCG tablet Take 1 tablet (50 mcg total) by mouth daily before breakfast.  90 tablet 3  . meloxicam (MOBIC) 15 MG tablet Take 15 mg by mouth as needed for pain.    . hydrocortisone (ANUSOL-HC) 25 MG suppository Use 1 suppository at bedtime (Patient not taking: Reported on 09/29/2014) 7 suppository 1   No current facility-administered medications for this visit.    Allergies-reviewed and updated No Known Allergies  History   Social History  . Marital Status: Married    Spouse Name: N/A  . Number of Children: 2  . Years of Education: N/A   Occupational History  . CPA    Social History Main Topics  . Smoking status: Former Smoker -- 0.25 packs/day for 20 years    Types: Cigarettes    Quit date: 11/25/2013  . Smokeless tobacco: Never Used     Comment: Tobbacco info given 10/13/12-smokes 4 a week  . Alcohol Use: 3.0 oz/week    5 Glasses of wine per week  . Drug Use: No  . Sexual Activity: Not on file   Other Topics Concern  . None   Social History Narrative   Married (patient of Dr. Yong Campos). 2 children 16 and 17 in 2016.       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 Oropharynx normal Neck: thyromegaly noted-diffusely enlarged goiter, no discrete nodules CV: RRR no murmurs rubs or gallops Lungs: CTAB no crackles, wheeze, rhonchi Abdomen: soft/nontender/nondistended/normal bowel sounds.  Ext: no edema Skin: warm, dry, no rash Neuro: grossly normal, moves all extremities, PERRLA   Assessment/Plan:  Hypothyroidism after radiation S:Graves disease found as goiter, within 4 months had quick increase in TSH. In 2008, pt had radioactive iodine for hyperthyroidism, due to a multinodular goiter. [place] in 2014 showed minimal change compared to 2008; She  used to take synthroid 50 mcg  But has not taken since November.  A/P: we will check TSH and T4 today. She requests that I continue to monitor levels of TSH and T4 and she will monitor symptoms and return to Dr. Loanne Drilling of endocrine only if abnormalities. Dr. Cordelia Pen notes suggest he thinks it is likely she will become hyperthyroid again so I will ask his opinion before agreeing to assuming care. Alternatively, consider seeing him every 2-3 years so she can remain established in case there is a change.     Internal hemorrhoids S: continues to have intermittent rectal bleeding. Only an adenoma in 2014 A/P: check cbc. Has suppositories available prn    GOITER, MULTINODULAR Consider imaging every 5 years. Last done 2008, 2014.     6 months  Future fasting labs Orders Placed This Encounter  Procedures  . Tdap vaccine greater than or equal to 7yo IM  . CBC    [place]    Standing Status: Future     Number of Occurrences:      Standing Expiration Date: 09/29/2015  . Comprehensive metabolic panel    [place]    Standing Status: Future     Number of Occurrences:      Standing Expiration Date: 09/29/2015    Order Specific Question:  Has the patient fasted?    Answer:  No  . TSH    [place]    Standing Status: Future     Number of Occurrences:      Standing Expiration Date: 09/29/2015  . T4, free    [place]    Standing Status: Future     Number of Occurrences:      Standing Expiration Date: 09/29/2015  . Lipid panel    [place]    Standing Status: Future     Number of Occurrences:      Standing Expiration Date: 09/29/2015    Order Specific Question:  Has the patient fasted?    Answer:  No

## 2014-09-29 NOTE — Assessment & Plan Note (Signed)
S: continues to have intermittent rectal bleeding. Only an adenoma in 2014 A/P: check cbc. Has suppositories available prn

## 2014-09-29 NOTE — Assessment & Plan Note (Signed)
Consider imaging every 5 years. Last done 2008, 2014.

## 2014-09-29 NOTE — Patient Instructions (Addendum)
Please have pap and mammogram results faxed to [place] at [phone].  Tdap today  Schedule a lab visit for next week. Come in fasting. We will update all your labs at that time.   I will reach out to Dr. Loanne Drilling and see if we can do your regular thyroid checks and only follow up perhaps every 2-3 years or if any issues. He may want to see you regularly seeing that he thinks you are likely to become hyperthyroid again  Check in 6 months from now

## 2014-10-05 ENCOUNTER — Telehealth: Payer: Self-pay | Admitting: Family Medicine

## 2014-10-05 DIAGNOSIS — E039 Hypothyroidism, unspecified: Secondary | ICD-10-CM

## 2014-10-05 NOTE — Telephone Encounter (Signed)
Labs have been entered for Allison Campos

## 2014-10-05 NOTE — Telephone Encounter (Signed)
Pt would like to go to [facility] for [facility] labs in the am.  It looks like the order is already in, is that [facility]

## 2014-10-06 ENCOUNTER — Other Ambulatory Visit (INDEPENDENT_AMBULATORY_CARE_PROVIDER_SITE_OTHER): Payer: BLUE CROSS/BLUE SHIELD

## 2014-10-06 ENCOUNTER — Other Ambulatory Visit: Payer: BLUE CROSS/BLUE SHIELD

## 2014-10-06 DIAGNOSIS — E039 Hypothyroidism, unspecified: Secondary | ICD-10-CM

## 2014-10-06 LAB — CBC
HCT: 41 % (ref 36.0–46.0)
Hemoglobin: 13.7 g/dL (ref 12.0–15.0)
MCHC: 33.5 g/dL (ref 30.0–36.0)
MCV: 88.5 fl (ref 78.0–100.0)
PLATELETS: 249 10*3/uL (ref 150.0–400.0)
RBC: 4.63 Mil/uL (ref 3.87–5.11)
RDW: 14.1 % (ref 11.5–15.5)
WBC: 6.7 10*3/uL (ref 4.0–10.5)

## 2014-10-06 LAB — COMPREHENSIVE METABOLIC PANEL
ALT: 20 U/L (ref 0–35)
AST: 26 U/L (ref 0–37)
Albumin: 4.2 g/dL (ref 3.5–5.2)
Alkaline Phosphatase: 51 U/L (ref 39–117)
BILIRUBIN TOTAL: 0.4 mg/dL (ref 0.2–1.2)
BUN: 15 mg/dL (ref 6–23)
CO2: 28 meq/L (ref 19–32)
Calcium: 9.5 mg/dL (ref 8.4–10.5)
Chloride: 102 mEq/L (ref 96–112)
Creatinine, Ser: 0.88 mg/dL (ref 0.40–1.20)
GFR: 72.03 mL/min (ref >60.00)
Glucose, Bld: 86 mg/dL (ref 70–99)
Potassium: 4.5 mEq/L (ref 3.5–5.1)
SODIUM: 137 meq/L (ref 135–145)
TOTAL PROTEIN: 7.1 g/dL (ref 6.0–8.3)

## 2014-10-06 LAB — TSH: TSH: 4.04 u[IU]/mL (ref 0.35–4.50)

## 2014-10-06 LAB — LIPID PANEL
Cholesterol: 170 mg/dL (ref 0–200)
HDL: 57.2 mg/dL (ref >39.00)
LDL CALC: 98 mg/dL (ref 0–99)
NonHDL: 112.8
Total CHOL/HDL Ratio: 3
Triglycerides: 75 mg/dL (ref 0.0–149.0)
VLDL: 15 mg/dL (ref 0.0–40.0)

## 2014-10-06 LAB — T4, FREE: FREE T4: 0.6 ng/dL (ref 0.60–1.60)

## 2014-10-19 LAB — HM PAP SMEAR: HM Pap smear: NORMAL

## 2014-11-02 ENCOUNTER — Telehealth: Payer: Self-pay

## 2014-11-02 ENCOUNTER — Encounter: Payer: Self-pay | Admitting: Family Medicine

## 2014-11-02 DIAGNOSIS — E039 Hypothyroidism, unspecified: Secondary | ICD-10-CM

## 2014-11-02 NOTE — Telephone Encounter (Signed)
I will get [facility] to order the repeat TSH and Free T4 in 6 weeks. Use hypothyroidism. Patient to Call to schedule a lab visit later this week for 6 weeks out.

## 2014-11-02 NOTE — Telephone Encounter (Signed)
Labs entered.

## 2014-11-08 ENCOUNTER — Encounter: Payer: Self-pay | Admitting: Family Medicine

## 2014-11-08 ENCOUNTER — Telehealth: Payer: Self-pay | Admitting: Family Medicine

## 2014-11-08 NOTE — Telephone Encounter (Signed)
See below

## 2014-11-08 NOTE — Telephone Encounter (Signed)
Sounds good. We can try to add this on to next set of labs

## 2014-11-08 NOTE — Telephone Encounter (Signed)
Patient stated that Dr. [name] needed a code Z13.21 screening Vitamin D 25-hydroxy. Thayer Headings stated that she would fax over the results from [facility]

## 2014-11-19 ENCOUNTER — Encounter: Payer: Self-pay | Admitting: Family Medicine

## 2014-11-19 ENCOUNTER — Other Ambulatory Visit: Payer: Self-pay

## 2014-11-19 MED ORDER — LEVOTHYROXINE SODIUM 50 MCG PO TABS: 50.0000 ug | ORAL_TABLET | Freq: Every day | ORAL | Status: DC

## 2014-12-13 ENCOUNTER — Encounter: Payer: Self-pay | Admitting: Endocrinology

## 2014-12-13 ENCOUNTER — Ambulatory Visit (INDEPENDENT_AMBULATORY_CARE_PROVIDER_SITE_OTHER): Payer: BLUE CROSS/BLUE SHIELD | Admitting: Endocrinology

## 2014-12-13 VITALS — BP 136/84 | HR 76 | Temp 98.8°F | Ht 64.0 in | Wt 152.0 lb

## 2014-12-13 DIAGNOSIS — E038 Other specified hypothyroidism: Secondary | ICD-10-CM

## 2014-12-13 DIAGNOSIS — E042 Nontoxic multinodular goiter: Secondary | ICD-10-CM

## 2014-12-13 LAB — TSH: TSH: 2.62 u[IU]/mL (ref 0.35–4.50)

## 2014-12-13 NOTE — Progress Notes (Signed)
Subjective:    Patient ID: Allison Campos, female    DOB: 07/19/1965, 49 y.o.   MRN: [id number]  HPI Pt returns for f/u of multinodular goiter and post-I-131 hypothyroidism (in 2008, pt had i-131 rx;  [place] in 2014 showed minimal change compared to 2008).  As of the May, 2016 TSH, she had been off synthroid x 6 months.  She is back on.  she does not notice the goiter.  pt states she feels well in general.   Past Medical History  Diagnosis Date  . HYPOTHYROIDISM, POST-RADIATION 07/19/2008  . GOITER, MULTINODULAR 01/08/2008  . 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 Hyperthyroidism   . Grave's disease   . Alcoholism in family     Past Surgical History  Procedure Laterality Date  . Shoulder arthroscopy      left  . Laparoscopy      Endometriosis  . Cesarean section      x2  . Foot surgery      to remove sewing needle    History   Social History  . Marital Status: Married    Spouse Name: N/A  . Number of Children: 2  . Years of Education: N/A   Occupational History  . CPA    Social History Main Topics  . Smoking status: Former Smoker -- 0.25 packs/day for 20 years    Types: Cigarettes    Quit date: 11/25/2013  . Smokeless tobacco: Never Used     Comment: Tobbacco info given 10/13/12-smokes 4 a week  . Alcohol Use: 3.0 oz/week    5 Glasses of wine per week  . Drug Use: No  . Sexual Activity: Not on file   Other Topics Concern  . Not on file   Social History Narrative   Married (patient of Dr. [name]). 2 children 16 and 17 in 2016.       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 Calcium-Vitamin D-Vitamin K (VIACTIV) [phone] MG-UNT-MCG CHEW Chew 1 tablet by mouth daily.      . cetirizine (ZYRTEC) 10 MG tablet Take 10 mg by mouth daily as needed.      [name] levothyroxine  (SYNTHROID, LEVOTHROID) 50 MCG tablet Take 1 tablet (50 mcg total) by mouth daily before breakfast. 90 tablet 3  . meloxicam (MOBIC) 15 MG tablet Take 15 mg by mouth as needed for pain.    . hydrocortisone (ANUSOL-HC) 25 MG suppository Use 1 suppository at bedtime (Patient not taking: Reported on 12/13/2014) 7 suppository 1   No current facility-administered medications on file prior to visit.    No Known Allergies  Family History  Problem Relation Age of Onset  . Alcohol abuse Father   . Arthritis Mother   . Uterine cancer Mother   . Thyroid disease Other     great grandmother-graves  . Breast cancer Maternal Aunt     mets to brain  . Colon cancer Maternal Uncle     BP 136/84 mmHg  Pulse 76  Temp(Src) 98.8 F (37.1 C) (Oral)  Ht 5\' 4"  (1.626 m)  Wt 152 lb (68.947 kg)  BMI 26.08 kg/m2  SpO2 97%    Review of Systems Denies neck pain.      Objective:   Physical Exam VITAL SIGNS:  See vs page  GENERAL: no distress NECK: thyroid is slightly enlarged, with irreg surface, but i cannot tell details.   Lab Results  Component Value Date   TSH 2.62 12/13/2014      Assessment & Plan:  Hypothyroidism: well-replaced.  Please continue the same medication.    Patient is advised the following: Patient Instructions  Let's recheck the ultrasound. blood tests are requested for you.  We'll let you know about the results.  You should have the blood test and examination of the thyroid each year.   Please come back here as needed.

## 2014-12-13 NOTE — Patient Instructions (Addendum)
Let's recheck the ultrasound. blood tests are requested for you.  We'll let you know about the results.  You should have the blood test and examination of the thyroid each year.   Please come back here as needed.

## 2015-01-04 ENCOUNTER — Ambulatory Visit
Admission: RE | Admit: 2015-01-04 | Discharge: 2015-01-04 | Disposition: A | Discharge status: Home / Self Care | Payer: BLUE CROSS/BLUE SHIELD | Source: Ambulatory Visit | Attending: Endocrinology | Admitting: Endocrinology

## 2015-01-04 DIAGNOSIS — E042 Nontoxic multinodular goiter: Secondary | ICD-10-CM

## 2015-03-07 ENCOUNTER — Ambulatory Visit (INDEPENDENT_AMBULATORY_CARE_PROVIDER_SITE_OTHER): Payer: BLUE CROSS/BLUE SHIELD | Admitting: Family Medicine

## 2015-03-07 DIAGNOSIS — Z23 Encounter for immunization: Secondary | ICD-10-CM | POA: Diagnosis not present

## 2015-06-29 ENCOUNTER — Other Ambulatory Visit: Payer: Self-pay

## 2015-06-29 DIAGNOSIS — Z1231 Encounter for screening mammogram for malignant neoplasm of breast: Secondary | ICD-10-CM

## 2015-07-18 ENCOUNTER — Ambulatory Visit: Payer: BLUE CROSS/BLUE SHIELD

## 2015-07-27 ENCOUNTER — Ambulatory Visit
Admission: RE | Admit: 2015-07-27 | Discharge: 2015-07-27 | Disposition: A | Discharge status: Home / Self Care | Payer: BLUE CROSS/BLUE SHIELD | Source: Ambulatory Visit

## 2015-07-27 DIAGNOSIS — Z1231 Encounter for screening mammogram for malignant neoplasm of breast: Secondary | ICD-10-CM

## 2015-09-02 ENCOUNTER — Ambulatory Visit (INDEPENDENT_AMBULATORY_CARE_PROVIDER_SITE_OTHER): Payer: BLUE CROSS/BLUE SHIELD | Admitting: Endocrinology

## 2015-09-02 ENCOUNTER — Encounter: Payer: Self-pay | Admitting: Endocrinology

## 2015-09-02 VITALS — BP 122/80 | HR 63 | Temp 98.0°F | Ht 64.0 in | Wt 161.0 lb

## 2015-09-02 DIAGNOSIS — E038 Other specified hypothyroidism: Secondary | ICD-10-CM | POA: Diagnosis not present

## 2015-09-02 DIAGNOSIS — R5383 Other fatigue: Secondary | ICD-10-CM | POA: Diagnosis not present

## 2015-09-02 NOTE — Progress Notes (Signed)
Subjective:    Patient ID: Allison Campos, female    DOB: 1966-03-04, 50 y.o.   MRN: [id number]  HPI Pt returns for f/u of multinodular goiter and post-I-131 hypothyroidism (in 2008, pt had i-131 rx;  [place] in 2016 showed minimal change compared to 2008).  She is back on synthroid since early 2016.  she does not notice the goiter.   Past Medical History  Diagnosis Date  . HYPOTHYROIDISM, POST-RADIATION 07/19/2008  . GOITER, MULTINODULAR 01/08/2008  . 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 Hyperthyroidism   . Grave's disease   . Alcoholism in family     Past Surgical History  Procedure Laterality Date  . Shoulder arthroscopy      left  . Laparoscopy      Endometriosis  . Cesarean section      x2  . Foot surgery      to remove sewing needle    Social History   Social History  . Marital Status: Married    Spouse Name: N/A  . Number of Children: 2  . Years of Education: N/A   Occupational History  . CPA    Social History Main Topics  . Smoking status: Former Smoker -- 0.25 packs/day for 20 years    Types: Cigarettes    Quit date: 11/25/2013  . Smokeless tobacco: Never Used     Comment: Tobbacco info given 10/13/12-smokes 4 a week  . Alcohol Use: 3.0 oz/week    5 Glasses of wine per week  . Drug Use: No  . Sexual Activity: Not on file   Other Topics Concern  . Not on file   Social History Narrative   Married (patient of Dr. [name]). 2 children 16 and 17 in 2016.       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 Calcium-Vitamin D-Vitamin K (VIACTIV) [id number] MG-UNT-MCG CHEW Chew 1 tablet by mouth daily.      . cetirizine (ZYRTEC) 10 MG tablet Take 10 mg by mouth daily as needed.      [name] levothyroxine (SYNTHROID, LEVOTHROID) 50 MCG tablet Take 1 tablet (50 mcg total) by  mouth daily before breakfast. 90 tablet 3  . fluticasone (FLONASE) 50 MCG/ACT nasal spray Place into both nostrils daily. Reported on 09/02/2015    . hydrocortisone (ANUSOL-HC) 25 MG suppository Use 1 suppository at bedtime (Patient not taking: Reported on 09/02/2015) 7 suppository 1  . meloxicam (MOBIC) 15 MG tablet Take 15 mg by mouth as needed for pain. Reported on 09/02/2015     No current facility-administered medications on file prior to visit.    No Known Allergies  Family History  Problem Relation Age of Onset  . Alcohol abuse Father   . Arthritis Mother   . Uterine cancer Mother   . Thyroid disease Other     great grandmother-graves  . Breast cancer Maternal Aunt     mets to brain  . Colon cancer Maternal Uncle     BP 122/80 mmHg  Pulse 63  Temp(Src) 98 F (36.7 C) (Oral)  Ht 5\' 4"  (1.626 m)  Wt 161 lb (73.029 kg)  BMI 27.62 kg/m2  SpO2 97%  Review of Systems She has fatigue.      Objective:   Physical Exam VITAL SIGNS:  See vs  page GENERAL: no distress NECK: 1-2 cm LLP thyroid nodule is palpable.        Assessment & Plan:  Post-I-131 hypothyroidism: due for recheck Multinodular goiter: clinically unchanged.  she can skip [place] for now  Patient is advised the following: Patient Instructions  blood tests are requested for you today.  We'll let you know about the results. You should have the blood test and examination of the thyroid each year.   Please come back here as needed.

## 2015-09-02 NOTE — Patient Instructions (Addendum)
blood tests are requested for you today.  We'll let you know about the results. You should have the blood test and examination of the thyroid each year.   Please come back here as needed.

## 2015-09-03 LAB — CBC WITH DIFFERENTIAL/PLATELET
Basophils Absolute: 0.1 10*3/uL (ref 0.0–0.2)
Basos: 1 %
EOS (ABSOLUTE): 0.2 10*3/uL (ref 0.0–0.4)
EOS: 3 %
HEMATOCRIT: 40.6 % (ref 34.0–46.6)
HEMOGLOBIN: 13.7 g/dL (ref 11.1–15.9)
Immature Grans (Abs): 0 10*3/uL (ref 0.0–0.1)
Immature Granulocytes: 0 %
LYMPHS ABS: 2.1 10*3/uL (ref 0.7–3.1)
Lymphs: 27 %
MCH: 28.8 pg (ref 26.6–33.0)
MCHC: 33.7 g/dL (ref 31.5–35.7)
MCV: 85 fL (ref 79–97)
MONOCYTES: 6 %
Monocytes Absolute: 0.4 10*3/uL (ref 0.1–0.9)
NEUTROS ABS: 4.9 10*3/uL (ref 1.4–7.0)
Neutrophils: 63 %
Platelets: 251 10*3/uL (ref 150–379)
RBC: 4.76 x10E6/uL (ref 3.77–5.28)
RDW: 14.2 % (ref 12.3–15.4)
WBC: 7.7 10*3/uL (ref 3.4–10.8)

## 2015-09-03 LAB — TSH: TSH: 3.44 u[IU]/mL (ref 0.450–4.500)

## 2015-09-08 DIAGNOSIS — M7072 Other bursitis of hip, left hip: Secondary | ICD-10-CM | POA: Diagnosis not present

## 2015-09-08 DIAGNOSIS — M7071 Other bursitis of hip, right hip: Secondary | ICD-10-CM | POA: Diagnosis not present

## 2015-10-21 ENCOUNTER — Encounter: Payer: Self-pay | Admitting: Endocrinology

## 2015-10-21 ENCOUNTER — Other Ambulatory Visit: Payer: Self-pay | Admitting: Endocrinology

## 2015-10-21 DIAGNOSIS — E038 Other specified hypothyroidism: Secondary | ICD-10-CM

## 2015-10-21 DIAGNOSIS — Z01419 Encounter for gynecological examination (general) (routine) without abnormal findings: Secondary | ICD-10-CM | POA: Diagnosis not present

## 2015-10-21 DIAGNOSIS — Z6826 Body mass index (BMI) 26.0-26.9, adult: Secondary | ICD-10-CM | POA: Diagnosis not present

## 2015-10-21 DIAGNOSIS — Z1321 Encounter for screening for nutritional disorder: Secondary | ICD-10-CM | POA: Diagnosis not present

## 2015-10-21 MED ORDER — LEVOTHYROXINE SODIUM 75 MCG PO TABS: 75.0000 ug | ORAL_TABLET | Freq: Every day | ORAL | Status: DC

## 2015-10-22 ENCOUNTER — Emergency Department (HOSPITAL_COMMUNITY)
Admission: EM | Admit: 2015-10-22 | Discharge: 2015-10-22 | Disposition: A | Discharge status: Home / Self Care | Payer: BLUE CROSS/BLUE SHIELD | Attending: Emergency Medicine | Admitting: Emergency Medicine

## 2015-10-22 ENCOUNTER — Emergency Department (HOSPITAL_COMMUNITY): Payer: BLUE CROSS/BLUE SHIELD

## 2015-10-22 ENCOUNTER — Encounter (HOSPITAL_COMMUNITY): Payer: Self-pay | Admitting: Emergency Medicine

## 2015-10-22 DIAGNOSIS — R079 Chest pain, unspecified: Secondary | ICD-10-CM | POA: Diagnosis not present

## 2015-10-22 DIAGNOSIS — S199XXA Unspecified injury of neck, initial encounter: Secondary | ICD-10-CM | POA: Diagnosis not present

## 2015-10-22 DIAGNOSIS — S60221A Contusion of right hand, initial encounter: Secondary | ICD-10-CM | POA: Insufficient documentation

## 2015-10-22 DIAGNOSIS — S20212A Contusion of left front wall of thorax, initial encounter: Secondary | ICD-10-CM

## 2015-10-22 DIAGNOSIS — S29091A Other injury of muscle and tendon of front wall of thorax, initial encounter: Secondary | ICD-10-CM | POA: Diagnosis present

## 2015-10-22 DIAGNOSIS — Y92410 Unspecified street and highway as the place of occurrence of the external cause: Secondary | ICD-10-CM | POA: Insufficient documentation

## 2015-10-22 DIAGNOSIS — S0990XA Unspecified injury of head, initial encounter: Secondary | ICD-10-CM | POA: Diagnosis not present

## 2015-10-22 DIAGNOSIS — S0093XA Contusion of unspecified part of head, initial encounter: Secondary | ICD-10-CM | POA: Insufficient documentation

## 2015-10-22 DIAGNOSIS — M542 Cervicalgia: Secondary | ICD-10-CM | POA: Diagnosis not present

## 2015-10-22 DIAGNOSIS — Y9389 Activity, other specified: Secondary | ICD-10-CM | POA: Insufficient documentation

## 2015-10-22 DIAGNOSIS — T148 Other injury of unspecified body region: Secondary | ICD-10-CM | POA: Diagnosis not present

## 2015-10-22 DIAGNOSIS — Y999 Unspecified external cause status: Secondary | ICD-10-CM | POA: Insufficient documentation

## 2015-10-22 DIAGNOSIS — S60511A Abrasion of right hand, initial encounter: Secondary | ICD-10-CM | POA: Diagnosis not present

## 2015-10-22 DIAGNOSIS — Z87891 Personal history of nicotine dependence: Secondary | ICD-10-CM | POA: Diagnosis not present

## 2015-10-22 DIAGNOSIS — Z79899 Other long term (current) drug therapy: Secondary | ICD-10-CM | POA: Diagnosis not present

## 2015-10-22 DIAGNOSIS — S299XXA Unspecified injury of thorax, initial encounter: Secondary | ICD-10-CM | POA: Diagnosis not present

## 2015-10-22 MED ORDER — IBUPROFEN 400 MG PO TABS
400.0000 mg | ORAL_TABLET | Freq: Once | ORAL | Status: AC
  Administered 2015-10-22: 400 mg via ORAL
  Filled 2015-10-22: qty 1

## 2015-10-22 MED ORDER — TRAMADOL HCL 50 MG PO TABS: 50.0000 mg | ORAL_TABLET | Freq: Four times a day (QID) | ORAL | Status: DC | PRN
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
## 2015-10-22 NOTE — ED Notes (Signed)
Pt ambulated to and from restroom; this tech placed pt back on monitor via BP and pulse oximetry upon return

## 2015-10-22 NOTE — Discharge Instructions (Signed)
It was our pleasure to provide your ER care today - we hope that you feel better.  You may take motrin or aleve as need for pain.  You may also take ultram as need for pain - no driving when taking.  Follow up with primary care doctor in 1 week if symptoms fail to improve/resolve.  Your xrays were read as showing no acute injury/fracture. Incidental note was made of a small, 8 mm, right thyroid nodule - follow up with your doctor in the next couple weeks - discuss follow up imaging/ultrasound with them.   No vigorous exercise or contact sport participation until after symptoms have resolved.     Return to ER if worse, new symptoms, severe pain, other concern.    Head Injury, Adult You have received a head injury. It does not appear serious at this time. Headaches and vomiting are common following head injury. It should be easy to awaken from sleeping. Sometimes it is necessary for you to stay in the emergency department for a while for observation. Sometimes admission to the hospital may be needed. After injuries such as yours, most problems occur within the first 24 hours, but side effects may occur up to 7-10 days after the injury. It is important for you to carefully monitor your condition and contact your health care provider or seek immediate medical care if there is a change in your condition. WHAT ARE THE TYPES OF HEAD INJURIES? Head injuries can be as minor as a bump. Some head injuries can be more severe. More severe head injuries include:  A jarring injury to the brain (concussion).  A bruise of the brain (contusion). This mean there is bleeding in the brain that can cause swelling.  A cracked skull (skull fracture).  Bleeding in the brain that collects, clots, and forms a bump (hematoma). WHAT CAUSES A HEAD INJURY? A serious head injury is most likely to happen to someone who is in a car wreck and is not wearing a seat belt. Other causes of major head injuries include  bicycle or motorcycle accidents, sports injuries, and falls. HOW ARE HEAD INJURIES DIAGNOSED? A complete history of the event leading to the injury and your current symptoms will be helpful in diagnosing head injuries. Many times, pictures of the brain, such as CT or MRI are needed to see the extent of the injury. Often, an overnight hospital stay is necessary for observation.  WHEN SHOULD I SEEK IMMEDIATE MEDICAL CARE?  You should get help right away if:  You have confusion or drowsiness.  You feel sick to your stomach (nauseous) or have continued, forceful vomiting.  You have dizziness or unsteadiness that is getting worse.  You have severe, continued headaches not relieved by medicine. Only take over-the-counter or prescription medicines for pain, fever, or discomfort as directed by your health care provider.  You do not have normal function of the arms or legs or are unable to walk.  You notice changes in the black spots in the center of the colored part of your eye (pupil).  You have a clear or bloody fluid coming from your nose or ears.  You have a loss of vision. During the next 24 hours after the injury, you must stay with someone who can watch you for the warning signs. This person should contact local emergency services (911 in the [place] if you have seizures, you become unconscious, or you are unable to wake up. HOW CAN I PREVENT A HEAD INJURY IN  THE FUTURE? The most important factor for preventing major head injuries is avoiding motor vehicle accidents. To minimize the potential for damage to your head, it is crucial to wear seat belts while riding in motor vehicles. Wearing helmets while bike riding and playing collision sports (like football) is also helpful. Also, avoiding dangerous activities around the house will further help reduce your risk of head injury.  WHEN CAN I RETURN TO NORMAL ACTIVITIES AND ATHLETICS? You should be reevaluated by your health care provider before  returning to these activities. If you have any of the following symptoms, you should not return to activities or contact sports until 1 week after the symptoms have stopped:  Persistent headache.  Dizziness or vertigo.  Poor attention and concentration.  Confusion.  Memory problems.  Nausea or vomiting.  Fatigue or tire easily.  Irritability.  Intolerant of bright lights or loud noises.  Anxiety or depression.  Disturbed sleep. MAKE SURE YOU:   Understand these instructions.  Will watch your condition.  Will get help right away if you are not doing well or get worse.   This information is not intended to replace advice given to you by your health care provider. Make sure you discuss any questions you have with your health care provider.   Document Released: 05/14/2005 Document Revised: 06/04/2014 Document Reviewed: 01/19/2013 Elsevier Interactive Patient Education 2016 [place].   Chest Contusion A chest contusion is a deep bruise on your chest area. Contusions are the result of an injury that caused bleeding under the skin. A chest contusion may involve bruising of the skin, muscles, or ribs. The contusion may turn blue, purple, or yellow. Minor injuries will give you a painless contusion, but more severe contusions may stay painful and swollen for a few weeks. CAUSES  A contusion is usually caused by a blow, trauma, or direct force to an area of the body. SYMPTOMS   Swelling and redness of the injured area.  Discoloration of the injured area.  Tenderness and soreness of the injured area.  Pain. DIAGNOSIS  The diagnosis can be made by taking a history and performing a physical exam. An X-ray, CT scan, or MRI may be needed to determine if there were any associated injuries, such as broken bones (fractures) or internal injuries. TREATMENT  Often, the best treatment for a chest contusion is resting, icing, and applying cold compresses to the injured area. Deep  breathing exercises may be recommended to reduce the risk of pneumonia. Over-the-counter medicines may also be recommended for pain control. HOME CARE INSTRUCTIONS   Put ice on the injured area.  Put ice in a plastic bag.  Place a towel between your skin and the bag.  Leave the ice on for 15-20 minutes, 03-04 times a day.  Only take over-the-counter or prescription medicines as directed by your caregiver. Your caregiver may recommend avoiding anti-inflammatory medicines (aspirin, ibuprofen, and naproxen) for 48 hours because these medicines may increase bruising.  Rest the injured area.  Perform deep-breathing exercises as directed by your caregiver.  Stop smoking if you smoke.  Do not lift objects over 5 pounds (2.3 kg) for 3 days or longer if recommended by your caregiver. SEEK IMMEDIATE MEDICAL CARE IF:   You have increased bruising or swelling.  You have pain that is getting worse.  You have difficulty breathing.  You have dizziness, weakness, or fainting.  You have blood in your urine or stool.  You cough up or vomit blood.  Your swelling  or pain is not relieved with medicines. MAKE SURE YOU:   Understand these instructions.  Will watch your condition.  Will get help right away if you are not doing well or get worse.   This information is not intended to replace advice given to you by your health care provider. Make sure you discuss any questions you have with your health care provider.   Document Released: 02/06/2001 Document Revised: 02/06/2012 Document Reviewed: 11/05/2011 Elsevier Interactive Patient Education 2016 [place].    Cryotherapy Cryotherapy means treatment with cold. Ice or gel packs can be used to reduce both pain and swelling. Ice is the most helpful within the first 24 to 48 hours after an injury or flare-up from overusing a muscle or joint. Sprains, strains, spasms, burning pain, shooting pain, and aches can all be eased with ice. Ice  can also be used when recovering from surgery. Ice is effective, has very few side effects, and is safe for most people to use. PRECAUTIONS  Ice is not a safe treatment option for people with:  Raynaud phenomenon. This is a condition affecting small blood vessels in the extremities. Exposure to cold may cause your problems to return.  Cold hypersensitivity. There are many forms of cold hypersensitivity, including:  Cold urticaria. Red, itchy hives appear on the skin when the tissues begin to warm after being iced.  Cold erythema. This is a red, itchy rash caused by exposure to cold.  Cold hemoglobinuria. Red blood cells break down when the tissues begin to warm after being iced. The hemoglobin that carry oxygen are passed into the urine because they cannot combine with blood proteins fast enough.  Numbness or altered sensitivity in the area being iced. If you have any of the following conditions, do not use ice until you have discussed cryotherapy with your caregiver:  Heart conditions, such as arrhythmia, angina, or chronic heart disease.  High blood pressure.  Healing wounds or open skin in the area being iced.  Current infections.  Rheumatoid arthritis.  Poor circulation.  Diabetes. Ice slows the blood flow in the region it is applied. This is beneficial when trying to stop inflamed tissues from spreading irritating chemicals to surrounding tissues. However, if you expose your skin to cold temperatures for too long or without the proper protection, you can damage your skin or nerves. Watch for signs of skin damage due to cold. HOME CARE INSTRUCTIONS Follow these tips to use ice and cold packs safely.  Place a dry or damp towel between the ice and skin. A damp towel will cool the skin more quickly, so you may need to shorten the time that the ice is used.  For a more rapid response, add gentle compression to the ice.  Ice for no more than 10 to 20 minutes at a time. The bonier  the area you are icing, the less time it will take to get the benefits of ice.  Check your skin after 5 minutes to make sure there are no signs of a poor response to cold or skin damage.  Rest 20 minutes or more between uses.  Once your skin is numb, you can end your treatment. You can test numbness by very lightly touching your skin. The touch should be so light that you do not see the skin dimple from the pressure of your fingertip. When using ice, most people will feel these normal sensations in this order: cold, burning, aching, and numbness.  Do not use ice on  someone who cannot communicate their responses to pain, such as small children or people with dementia. HOW TO MAKE AN ICE PACK Ice packs are the most common way to use ice therapy. Other methods include ice massage, ice baths, and cryosprays. Muscle creams that cause a cold, tingly feeling do not offer the same benefits that ice offers and should not be used as a substitute unless recommended by your caregiver. To make an ice pack, do one of the following:  Place crushed ice or a bag of frozen vegetables in a sealable plastic bag. Squeeze out the excess air. Place this bag inside another plastic bag. Slide the bag into a pillowcase or place a damp towel between your skin and the bag.  Mix 3 parts water with 1 part rubbing alcohol. Freeze the mixture in a sealable plastic bag. When you remove the mixture from the freezer, it will be slushy. Squeeze out the excess air. Place this bag inside another plastic bag. Slide the bag into a pillowcase or place a damp towel between your skin and the bag. SEEK MEDICAL CARE IF:  You develop white spots on your skin. This may give the skin a blotchy (mottled) appearance.  Your skin turns blue or pale.  Your skin becomes waxy or hard.  Your swelling gets worse. MAKE SURE YOU:   Understand these instructions.  Will watch your condition.  Will get help right away if you are not doing well or  get worse.   This information is not intended to replace advice given to you by your health care provider. Make sure you discuss any questions you have with your health care provider.   Document Released: 01/08/2011 Document Revised: 06/04/2014 Document Reviewed: 01/08/2011 Elsevier Interactive Patient Education [employer].
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
## 2015-10-22 NOTE — ED Notes (Signed)
Portable xray in progress

## 2015-10-25 ENCOUNTER — Encounter: Payer: Self-pay | Admitting: Family Medicine

## 2015-10-25 ENCOUNTER — Ambulatory Visit (INDEPENDENT_AMBULATORY_CARE_PROVIDER_SITE_OTHER): Payer: BLUE CROSS/BLUE SHIELD | Admitting: Family Medicine

## 2015-10-25 DIAGNOSIS — M546 Pain in thoracic spine: Secondary | ICD-10-CM | POA: Diagnosis not present

## 2015-10-25 DIAGNOSIS — M25511 Pain in right shoulder: Secondary | ICD-10-CM | POA: Diagnosis not present

## 2015-10-25 DIAGNOSIS — M542 Cervicalgia: Secondary | ICD-10-CM | POA: Diagnosis not present

## 2015-10-25 DIAGNOSIS — M549 Dorsalgia, unspecified: Secondary | ICD-10-CM

## 2015-10-25 DIAGNOSIS — G44309 Post-traumatic headache, unspecified, not intractable: Secondary | ICD-10-CM

## 2015-10-25 MED ORDER — METHOCARBAMOL 500 MG PO TABS: 500.0000 mg | ORAL_TABLET | Freq: Three times a day (TID) | ORAL | Status: AC | PRN

## 2015-10-25 NOTE — Progress Notes (Signed)
Pre visit review using our clinic review tool, if applicable. No additional management support is needed unless otherwise documented below in the visit note. 
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
## 2015-10-25 NOTE — Patient Instructions (Signed)
A few things to remember from today's visit:   1. Right shoulder pain  - Ambulatory referral to Physical Therapy  2. Upper back pain  - methocarbamol (ROBAXIN) 500 MG tablet; Take 1 tablet (500 mg total) by mouth every 8 (eight) hours as needed for muscle spasms.  Dispense: 60 tablet; Refill: 0 - Ambulatory referral to Physical Therapy  3. Cervicalgia  - Ambulatory referral to Physical Therapy

## 2015-10-26 ENCOUNTER — Ambulatory Visit: Payer: BLUE CROSS/BLUE SHIELD | Attending: Family Medicine

## 2015-10-26 DIAGNOSIS — M25511 Pain in right shoulder: Secondary | ICD-10-CM | POA: Diagnosis not present

## 2015-10-26 DIAGNOSIS — M542 Cervicalgia: Secondary | ICD-10-CM | POA: Diagnosis not present

## 2015-10-26 DIAGNOSIS — M546 Pain in thoracic spine: Secondary | ICD-10-CM | POA: Insufficient documentation

## 2015-10-26 DIAGNOSIS — M25611 Stiffness of right shoulder, not elsewhere classified: Secondary | ICD-10-CM | POA: Diagnosis not present
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
## 2015-10-26 NOTE — Patient Instructions (Signed)
PERFORM ALL EXERCISES GENTLY AND WITH GOOD POSTURE.    20 SECOND HOLD, 3 REPS TO EACH SIDE. 4-5 TIMES EACH DAY.   AROM: Neck Rotation   Turn head slowly to look over one shoulder, then the other.   AROM: Neck Flexion   Bend head forward.   AROM: Lateral Neck Flexion   Slowly tilt head toward one shoulder, then the other.   Flexion (Assistive)    Clasp hands together and raise arms above head, keeping elbows as straight as possible. Can be done sitting or lying.  Hold 10 seconds  Repeat __5__ times. Do __3__ sessions per day.  Copyright  VHI. All rights reserved.   [facility] [address] [place], [place] [address] Phone # [phone] Fax [phone]    Posture - Standing   Good posture is important. Avoid slouching and forward head thrust. Maintain curve in low back and align ears over shoulders, hips over ankles.  Pull your belly button in toward your back bone. Posture Tips DO: - stand tall and erect - keep chin tucked in - keep head and shoulders in alignment - check posture regularly in mirror or large window - pull head back against headrest in car seat;  Change your position often.  Sit with lumbar support. DON'T: - slouch or slump while watching TV or reading - sit, stand or lie in one position  for too long;  Sitting is especially hard on the spine so if you sit at a desk/use the computer, then stand up often! Copyright  VHI. All rights reserved.  Posture - Sitting  Sit upright, head facing forward. Try using a roll to support lower back. Keep shoulders relaxed, and avoid rounded back. Keep hips level with knees. Avoid crossing legs for long periods. Copyright  VHI. All rights reserved.  Chronic neck strain can develop because of poor posture and faulty work habits  Postural strain related to slumped sitting and forward head posture is a leading cause of headaches, neck and upper back pain  General strengthening and  flexibility exercises are helpful in the treatment of neck pain.  Most importantly, you should learn to correct the posture that may be contributing to chronic pain.   Change positions frequently  Change your work or home environment to improve posture and mechanics.

## 2015-10-27 ENCOUNTER — Ambulatory Visit: Payer: BLUE CROSS/BLUE SHIELD | Attending: Family Medicine | Admitting: Physical Therapy

## 2015-10-27 DIAGNOSIS — M25511 Pain in right shoulder: Secondary | ICD-10-CM

## 2015-10-27 DIAGNOSIS — M25611 Stiffness of right shoulder, not elsewhere classified: Secondary | ICD-10-CM | POA: Diagnosis not present

## 2015-10-27 DIAGNOSIS — M546 Pain in thoracic spine: Secondary | ICD-10-CM

## 2015-10-27 DIAGNOSIS — M542 Cervicalgia: Secondary | ICD-10-CM | POA: Insufficient documentation
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
## 2015-10-28 ENCOUNTER — Telehealth: Payer: Self-pay | Admitting: Family Medicine

## 2015-10-28 ENCOUNTER — Encounter: Payer: Self-pay | Admitting: Family Medicine

## 2015-10-28 ENCOUNTER — Ambulatory Visit (INDEPENDENT_AMBULATORY_CARE_PROVIDER_SITE_OTHER): Payer: BLUE CROSS/BLUE SHIELD | Admitting: Family Medicine

## 2015-10-28 ENCOUNTER — Encounter: Payer: BLUE CROSS/BLUE SHIELD | Admitting: Physical Therapy

## 2015-10-28 VITALS — BP 98/70 | HR 66 | Temp 98.3°F | Ht 64.0 in

## 2015-10-28 DIAGNOSIS — M542 Cervicalgia: Secondary | ICD-10-CM

## 2015-10-28 DIAGNOSIS — R519 Headache, unspecified: Secondary | ICD-10-CM

## 2015-10-28 DIAGNOSIS — R51 Headache: Secondary | ICD-10-CM

## 2015-10-28 NOTE — Telephone Encounter (Signed)
FYI, pt scheduled to see Dr.Kim
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
## 2015-10-28 NOTE — Telephone Encounter (Signed)
Patient is in the office now to see Dr Maudie Mercury.

## 2015-10-28 NOTE — Progress Notes (Signed)
Pre visit review using our clinic review tool, if applicable. No additional management support is needed unless otherwise documented below in the visit note. Patient declines weight measurement today. 

## 2015-10-28 NOTE — Patient Instructions (Signed)
BEFORE YOU LEAVE: -follow up: in 1-2 weeks with Dr. [name]  Use the muscle relaxer at night  Try tylenol 500-1000mg  and topical over the counter sports creams(ice hot, menthol or capsaisin products) for pain during the day. Tramadol if needed for severe pain.  Gently ease back into physical activity.  Seek care sooner if worsening or new symptoms.

## 2015-10-31 ENCOUNTER — Ambulatory Visit: Payer: BLUE CROSS/BLUE SHIELD | Admitting: Physical Therapy

## 2015-10-31 ENCOUNTER — Encounter: Payer: BLUE CROSS/BLUE SHIELD | Admitting: Physical Therapy

## 2015-10-31 ENCOUNTER — Encounter: Payer: Self-pay | Admitting: Physical Therapy

## 2015-10-31 DIAGNOSIS — M25611 Stiffness of right shoulder, not elsewhere classified: Secondary | ICD-10-CM | POA: Diagnosis not present

## 2015-10-31 DIAGNOSIS — M542 Cervicalgia: Secondary | ICD-10-CM

## 2015-10-31 DIAGNOSIS — M25511 Pain in right shoulder: Secondary | ICD-10-CM

## 2015-10-31 DIAGNOSIS — M546 Pain in thoracic spine: Secondary | ICD-10-CM

## 2015-10-31 NOTE — Therapy (Signed)
[facility] [address] [place], [place], [address] Phone: [phone]   Fax:  [phone]  Physical Therapy Treatment  Patient Details  Name: Allison Campos MRN: [id number] Date of Birth: 10-12-1965 Referring Provider: Martinique, Betty, MD  Encounter Date: 10/31/2015      PT End of Session - 10/31/15 0857    Visit Number 3   Date for PT Re-Evaluation 12/21/15   PT Start Time 0852   PT Stop Time 0955   PT Time Calculation (Allison) 63 Allison   Activity Tolerance Patient tolerated treatment well   Behavior During Therapy [facility] for tasks assessed/performed      Past Medical History  Diagnosis Date  . HYPOTHYROIDISM, POST-RADIATION 07/19/2008  . GOITER, MULTINODULAR 01/08/2008  . 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 Hyperthyroidism   . Grave's disease   . Alcoholism in family     Past Surgical History  Procedure Laterality Date  . Shoulder arthroscopy      left  . Laparoscopy      Endometriosis  . Cesarean section      x2  . Foot surgery      to remove sewing needle    There were no vitals filed for this visit.      Subjective Assessment - 10/31/15 0854    Subjective I am not doing any of my regular exercises. Tried walking for 7 minutes and this gave her a HA. No HA this morning. Shoulder feeling better, putting clothes on easier.    Currently in Pain? No/denies   Multiple Pain Sites No                         [facility] Adult PT Treatment/Exercise - 10/31/15 0001    Neck Exercises: Supine   Other Supine Exercise Breathing on purple ball x 1 Allison, then rotations 10x    Shoulder Exercises: Supine   Horizontal ABduction AROM;Both  2 x 5, pt used LT UE to bring arm back   Flexion AROM;10 reps   Other Supine Exercises Collarbone lengthener, shoulder press 2x 5   Shoulder Exercises: Pulleys   Flexion 2 minutes   Moist Heat Therapy   Number Minutes Moist Heat 15 Minutes   Moist  Heat Location Shoulder;Cervical   Electrical Stimulation   Electrical Stimulation Location RT cervical & shoulder   Electrical Stimulation Action IFC   Electrical Stimulation Parameters to pt tolerance   Electrical Stimulation Goals Pain   Manual Therapy   Manual Therapy Soft tissue mobilization   Soft tissue mobilization RT cervical                  PT Short Term Goals - 10/31/15 0946    PT SHORT TERM GOAL #1   Title be independent in initial HEP   Time 4   Period Weeks   Status Achieved   PT SHORT TERM GOAL #3   Title demonstrate and verbalize posture corrections at work   Time 4   Period Weeks   Status Achieved   PT SHORT TERM GOAL #4   Title demonstrate Rt shoulder AROM flexion to > or = to 110 degrees for reaching overhead   Time 4   Period Weeks   Status On-going   PT SHORT TERM GOAL #5   Title report a 25% reduction in neck and shoulder pain with sitting and ADLs   Time 4   Period Weeks   Status On-going  PT Long Term Goals - 10/27/15 0825    PT LONG TERM GOAL #1   Title be independent in advanced HEP   Time 8   Period Weeks   Status On-going   PT LONG TERM GOAL #2   Title reduce FOTO to < or = to 34% limitation   Time 8   Period Weeks   Status On-going   PT LONG TERM GOAL #3   Title demonstrate full cervical AROM without to improve safety with driving and ease with ADLs   Time 8   Period Weeks   Status On-going   PT LONG TERM GOAL #4   Title get dressed with < or = to 3/10 Rt shoulder pain   Time 8   Period Weeks   Status On-going   PT LONG TERM GOAL #5   Title report a 60% reduction in neck and shoulder pain with ADLs and work tasks   Time 8   Period Weeks   Status On-going   PT LONG TERM GOAL #6   Title return to regular exercise routine without limitation   Time 8   Period Weeks   Status On-going   PT LONG TERM GOAL #7   Title demonstrate full Rt shoulder AROM without significant increase in pain   Time 8   Period  Weeks   Status On-going               Plan - 10/31/15 0858    Clinical Impression Statement pt is now able to work from home for 1 1/2 hrs now comfortably, meeting goal. She continues to have shoulder pain although she can now pull her clothes on & off better and with less pain. Pt has multiple active trigger points along the lateral side of her cervical spine.    Rehab Potential Good   PT Frequency 3x / week   PT Duration 8 weeks   PT Treatment/Interventions ADLs/Self Care Home Management;Cryotherapy;Electrical Stimulation;Iontophoresis 4mg /ml Dexamethasone;Moist Heat;Therapeutic exercise;Therapeutic activities;Functional mobility training;Ultrasound;Neuromuscular re-education;Traction;Patient/family education;Manual techniques;Taping;Dry needling;Passive range of motion   PT Next Visit Plan Manual work to RT cervical spine, PROM RT shoulder, pulleys, release work for neck in supine with ball or foam roll.    Consulted and Agree with Plan of Care Patient      Patient will benefit from skilled therapeutic intervention in order to improve the following deficits and impairments:  Postural dysfunction, Decreased strength, Improper body mechanics, Impaired flexibility, Pain, Decreased activity tolerance, Decreased endurance, Increased muscle spasms, Decreased range of motion  Visit Diagnosis: Pain in right shoulder  Stiffness of right shoulder, not elsewhere classified  Cervicalgia  Pain in thoracic spine     Problem List Patient Active Problem List   Diagnosis Date Noted  . Fatigue 09/02/2015  . Hx of adenomatous colonic polyps 09/29/2014  . Low back pain 09/29/2014  . Former smoker 09/29/2014  . Internal hemorrhoids 10/13/2012  . 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 GOITER, MULTINODULAR 01/08/2008  . ADJUSTMENT DISORDER WITH MIXED FEATURES 10/24/2006    Shawnta Schlegel, PTA 10/31/2015, 9:51 AM  [facility] [address], [place], [place], [address] Phone: [phone]   Fax:  [phone]  Name: Allison Campos MRN: [id number] Date of Birth: July 06, 1965

## 2015-10-31 NOTE — Patient Instructions (Signed)
Pt interested in going to the pool. We discussed potential things to try: floating in the deep water and/or very gentle movements in the water with no push/pull against the water for resistance. Recommended no longer than 30 min, to rest after, what gravity may fell like post water activity. Pt verbally understood and agreed.

## 2015-11-02 ENCOUNTER — Ambulatory Visit: Payer: BLUE CROSS/BLUE SHIELD | Admitting: Physical Therapy

## 2015-11-02 ENCOUNTER — Encounter: Payer: Self-pay | Admitting: Physical Therapy

## 2015-11-02 DIAGNOSIS — M25511 Pain in right shoulder: Secondary | ICD-10-CM | POA: Diagnosis not present

## 2015-11-02 DIAGNOSIS — M546 Pain in thoracic spine: Secondary | ICD-10-CM | POA: Diagnosis not present

## 2015-11-02 DIAGNOSIS — M542 Cervicalgia: Secondary | ICD-10-CM | POA: Diagnosis not present

## 2015-11-02 DIAGNOSIS — M25611 Stiffness of right shoulder, not elsewhere classified: Secondary | ICD-10-CM | POA: Diagnosis not present

## 2015-11-02 NOTE — Therapy (Signed)
[facility] [address], [place], [place], [address] Phone: [phone]   Fax:  [phone]  Physical Therapy Treatment  Patient Details  Name: Allison Campos MRN: [id number] Date of Birth: 04-22-1966 Referring Provider: Martinique, Betty, MD  Encounter Date: 11/02/2015      PT End of Session - 11/02/15 0756    Visit Number 4   Date for PT Re-Evaluation 12/21/15   PT Start Time 0757   PT Stop Time 0900   PT Time Calculation (min) 63 min   Activity Tolerance Patient tolerated treatment well   Behavior During Therapy [facility] for tasks assessed/performed      Past Medical History  Diagnosis Date  . HYPOTHYROIDISM, POST-RADIATION 07/19/2008  . GOITER, MULTINODULAR 01/08/2008  . 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 Hyperthyroidism   . Grave's disease   . Alcoholism in family     Past Surgical History  Procedure Laterality Date  . Shoulder arthroscopy      left  . Laparoscopy      Endometriosis  . Cesarean section      x2  . Foot surgery      to remove sewing needle    There were no vitals filed for this visit.      Subjective Assessment - 11/02/15 0831    Subjective Felt good after PT. The next day I tried to do more but that was too much. My shoulder hurt and I had a really bad HA.    Currently in Pain? Yes   Pain Score 8    Pain Location Shoulder   Pain Orientation Right   Pain Descriptors / Indicators Headache;Sharp   Aggravating Factors  Working upright   Pain Relieving Factors Rest, changing posiiton   Multiple Pain Sites No                         [facility] Adult PT Treatment/Exercise - 11/02/15 0001    Shoulder Exercises: Supine   Other Supine Exercises Purple ball release    Moist Heat Therapy   Number Minutes Moist Heat 15 Minutes   Moist Heat Location Shoulder;Cervical   Electrical Stimulation   Electrical Stimulation Location RT cervical & shoulder   Electrical  Stimulation Action IFC   Electrical Stimulation Goals Pain   Manual Therapy   Manual Therapy Soft tissue mobilization   Soft tissue mobilization RT cervical  Added RT side cervical static stretches & PROM   Passive ROM RT shoulder flexion & abduction & scaption                  PT Short Term Goals - 10/31/15 0946    PT SHORT TERM GOAL #1   Title be independent in initial HEP   Time 4   Period Weeks   Status Achieved   PT SHORT TERM GOAL #3   Title demonstrate and verbalize posture corrections at work   Time 4   Period Weeks   Status Achieved   PT SHORT TERM GOAL #4   Title demonstrate Rt shoulder AROM flexion to > or = to 110 degrees for reaching overhead   Time 4   Period Weeks   Status On-going   PT SHORT TERM GOAL #5   Title report a 25% reduction in neck and shoulder pain with sitting and ADLs   Time 4   Period Weeks   Status On-going  PT Long Term Goals - 10/27/15 0825    PT LONG TERM GOAL #1   Title be independent in advanced HEP   Time 8   Period Weeks   Status On-going   PT LONG TERM GOAL #2   Title reduce FOTO to < or = to 34% limitation   Time 8   Period Weeks   Status On-going   PT LONG TERM GOAL #3   Title demonstrate full cervical AROM without to improve safety with driving and ease with ADLs   Time 8   Period Weeks   Status On-going   PT LONG TERM GOAL #4   Title get dressed with < or = to 3/10 Rt shoulder pain   Time 8   Period Weeks   Status On-going   PT LONG TERM GOAL #5   Title report a 60% reduction in neck and shoulder pain with ADLs and work tasks   Time 8   Period Weeks   Status On-going   PT LONG TERM GOAL #6   Title return to regular exercise routine without limitation   Time 8   Period Weeks   Status On-going   PT LONG TERM GOAL #7   Title demonstrate full Rt shoulder AROM without significant increase in pain   Time 8   Period Weeks   Status On-going               Plan - 11/02/15 [id number]     Clinical Impression Statement Some improvement but slow secondary to HAs and shoulder pain. PROM of RT shoulder was full with no restrictions and pain free. She is trying to increase her time she can work upright but struggling to find a time that doesn't bring on a killer HA or shoulder pain. Soft tissues on the RT side of her neck felt  softer and not as thick.  Was able to work deeper into these tissues .    Rehab Potential Good   PT Frequency 3x / week   PT Duration 8 weeks   PT Treatment/Interventions ADLs/Self Care Home Management;Cryotherapy;Electrical Stimulation;Iontophoresis 4mg /ml Dexamethasone;Moist Heat;Therapeutic exercise;Therapeutic activities;Functional mobility training;Ultrasound;Neuromuscular re-education;Traction;Patient/family education;Manual techniques;Taping;Dry needling;Passive range of motion   Consulted and Agree with Plan of Care Patient      Patient will benefit from skilled therapeutic intervention in order to improve the following deficits and impairments:  Postural dysfunction, Decreased strength, Improper body mechanics, Impaired flexibility, Pain, Decreased activity tolerance, Decreased endurance, Increased muscle spasms, Decreased range of motion  Visit Diagnosis: Pain in right shoulder  Stiffness of right shoulder, not elsewhere classified  Cervicalgia  Pain in thoracic spine     Problem List Patient Active Problem List   Diagnosis Date Noted  . Fatigue 09/02/2015  . Hx of adenomatous colonic polyps 09/29/2014  . Low back pain 09/29/2014  . Former smoker 09/29/2014  . Internal hemorrhoids 10/13/2012  . 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 GOITER, MULTINODULAR 01/08/2008  . ADJUSTMENT DISORDER WITH MIXED FEATURES 10/24/2006    Allison Campos, PTA 11/02/2015, 8:46 AM  [facility] [address] [place], [place], [address] Phone: [phone]   Fax:  [phone]  Name:  Allison Campos MRN: [id number] Date of Birth: 04/17/1966

## 2015-11-03 ENCOUNTER — Encounter: Payer: Self-pay | Admitting: Family Medicine

## 2015-11-03 ENCOUNTER — Telehealth: Payer: Self-pay | Admitting: Family Medicine

## 2015-11-03 ENCOUNTER — Ambulatory Visit (INDEPENDENT_AMBULATORY_CARE_PROVIDER_SITE_OTHER): Payer: BLUE CROSS/BLUE SHIELD | Admitting: Family Medicine

## 2015-11-03 ENCOUNTER — Encounter: Payer: BLUE CROSS/BLUE SHIELD | Admitting: Physical Therapy

## 2015-11-03 VITALS — BP 164/92 | HR 70 | Temp 98.6°F | Ht 64.0 in | Wt 154.0 lb

## 2015-11-03 DIAGNOSIS — R41840 Attention and concentration deficit: Secondary | ICD-10-CM | POA: Diagnosis not present

## 2015-11-03 DIAGNOSIS — F0781 Postconcussional syndrome: Secondary | ICD-10-CM | POA: Diagnosis not present

## 2015-11-03 DIAGNOSIS — G44301 Post-traumatic headache, unspecified, intractable: Secondary | ICD-10-CM | POA: Diagnosis not present

## 2015-11-03 NOTE — Patient Instructions (Signed)
Let's get more aggressive with post concussive symptom management  I want you to take next week off of work and try to minimize mental stimulation as much as possible. No high level thinking or straining your brain. Try to minimize phone time. I am ok with some walking or sitting in pool. Tylenol and muscle relaxant ok for management of symptoms including headache and tension. Light reading, listening to music ok.  Follow up on 16th. I am hopeful you will have made some significant improvements. We may order a follow up MRI if not

## 2015-11-03 NOTE — Progress Notes (Signed)
Pre visit review using our clinic review tool, if applicable. No additional management support is needed unless otherwise documented below in the visit note. 
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
## 2015-11-03 NOTE — Telephone Encounter (Signed)
Pt scheduled today to see Dr. Hunter. 

## 2015-11-03 NOTE — Telephone Encounter (Signed)
Noted  

## 2015-11-04 ENCOUNTER — Ambulatory Visit: Payer: BLUE CROSS/BLUE SHIELD | Admitting: Physical Therapy

## 2015-11-04 ENCOUNTER — Encounter: Payer: Self-pay | Admitting: Physical Therapy

## 2015-11-04 DIAGNOSIS — M25611 Stiffness of right shoulder, not elsewhere classified: Secondary | ICD-10-CM

## 2015-11-04 DIAGNOSIS — M25511 Pain in right shoulder: Secondary | ICD-10-CM | POA: Diagnosis not present

## 2015-11-04 DIAGNOSIS — M542 Cervicalgia: Secondary | ICD-10-CM | POA: Diagnosis not present

## 2015-11-04 DIAGNOSIS — M546 Pain in thoracic spine: Secondary | ICD-10-CM | POA: Diagnosis not present
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
## 2015-11-07 ENCOUNTER — Encounter: Payer: Self-pay | Admitting: Physical Therapy

## 2015-11-07 ENCOUNTER — Ambulatory Visit: Payer: BLUE CROSS/BLUE SHIELD | Admitting: Physical Therapy

## 2015-11-07 DIAGNOSIS — M25611 Stiffness of right shoulder, not elsewhere classified: Secondary | ICD-10-CM

## 2015-11-07 DIAGNOSIS — M542 Cervicalgia: Secondary | ICD-10-CM

## 2015-11-07 DIAGNOSIS — M546 Pain in thoracic spine: Secondary | ICD-10-CM | POA: Diagnosis not present

## 2015-11-07 DIAGNOSIS — M25511 Pain in right shoulder: Secondary | ICD-10-CM

## 2015-11-07 NOTE — Therapy (Signed)
[facility] [address], [place], [place], [address] Phone: [phone]   Fax:  [phone]  Physical Therapy Treatment  Patient Details  Name: Allison Campos MRN: [id number] Date of Birth: 14-Aug-1965 Referring Provider: Martinique, Betty, MD  Encounter Date: 11/07/2015      PT End of Session - 11/07/15 1532    Visit Number 6   Date for PT Re-Evaluation 12/21/15   PT Start Time 1532   PT Stop Time 1630   PT Time Calculation (min) 58 min   Activity Tolerance Patient tolerated treatment well   Behavior During Therapy [facility] for tasks assessed/performed      Past Medical History  Diagnosis Date  . HYPOTHYROIDISM, POST-RADIATION 07/19/2008  . GOITER, MULTINODULAR 01/08/2008  . 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 Hyperthyroidism   . Grave's disease   . Alcoholism in family     Past Surgical History  Procedure Laterality Date  . Shoulder arthroscopy      left  . Laparoscopy      Endometriosis  . Cesarean section      x2  . Foot surgery      to remove sewing needle    There were no vitals filed for this visit.      Subjective Assessment - 11/07/15 1535    Subjective I am reaching a little now with my right arm/shoulder. I overdid my Saturday and hurt bad on Sunday. I actually felt good on Saturday that is why she over did.      Currently in Pain? Yes   Pain Score 4    Pain Location Shoulder   Pain Orientation Right;Anterior   Pain Descriptors / Indicators Aching;Sore   Aggravating Factors  Being upright   Pain Relieving Factors Rest   Multiple Pain Sites No                         [facility] Adult PT Treatment/Exercise - 11/07/15 0001    Shoulder Exercises: Supine   Other Supine Exercises Supine scissor arms 10x bil    Other Supine Exercises Foam roll  x 1 min for diaphragmatic breating    Shoulder Exercises: Standing   Other Standing Exercises RT pectoral release with  purple ball x 2 min changing positions every few breaths.    Shoulder Exercises: Pulleys   Flexion 3 minutes   Moist Heat Therapy   Number Minutes Moist Heat 15 Minutes   Moist Heat Location --  Neck and shoulder   Electrical Stimulation   Electrical Stimulation Location RT shoulder/ pectoral   Electrical Stimulation Action IFC   Electrical Stimulation Goals Pain   Manual Therapy   Manual Therapy Soft tissue mobilization  RT pesctoral, PROM RT shoulder                  PT Short Term Goals - 11/07/15 1542    PT SHORT TERM GOAL #4   Title demonstrate Rt shoulder AROM flexion to > or = to 110 degrees for reaching overhead   Time 4   Period Weeks   Status Achieved  110 degrees   PT SHORT TERM GOAL #5   Title report a 25% reduction in neck and shoulder pain with sitting and ADLs   Time 4   Period Weeks   Status On-going  20%           PT Long Term Goals - 10/27/15 0825    PT LONG  TERM GOAL #1   Title be independent in advanced HEP   Time 8   Period Weeks   Status On-going   PT LONG TERM GOAL #2   Title reduce FOTO to < or = to 34% limitation   Time 8   Period Weeks   Status On-going   PT LONG TERM GOAL #3   Title demonstrate full cervical AROM without to improve safety with driving and ease with ADLs   Time 8   Period Weeks   Status On-going   PT LONG TERM GOAL #4   Title get dressed with < or = to 3/10 Rt shoulder pain   Time 8   Period Weeks   Status On-going   PT LONG TERM GOAL #5   Title report a 60% reduction in neck and shoulder pain with ADLs and work tasks   Time 8   Period Weeks   Status On-going   PT LONG TERM GOAL #6   Title return to regular exercise routine without limitation   Time 8   Period Weeks   Status On-going   PT LONG TERM GOAL #7   Title demonstrate full Rt shoulder AROM without significant increase in pain   Time 8   Period Weeks   Status On-going               Plan - 11/07/15 1617    Clinical Impression  Statement Pt RT shoulder ROM improved, meeting STGs today. She tolerated all th esupine exercises very well today. Cervical muscles also improving in length and softness.    Rehab Potential Good   PT Frequency 3x / week   PT Duration 8 weeks   PT Treatment/Interventions ADLs/Self Care Home Management;Cryotherapy;Electrical Stimulation;Iontophoresis 4mg /ml Dexamethasone;Moist Heat;Therapeutic exercise;Therapeutic activities;Functional mobility training;Ultrasound;Neuromuscular re-education;Traction;Patient/family education;Manual techniques;Taping;Dry needling;Passive range of motion   PT Next Visit Plan Manual work to RT cervical spine, PROM RT shoulder, pulleys, release work for neck in supine with ball or foam roll.    Consulted and Agree with Plan of Care Patient      Patient will benefit from skilled therapeutic intervention in order to improve the following deficits and impairments:  Postural dysfunction, Decreased strength, Improper body mechanics, Impaired flexibility, Pain, Decreased activity tolerance, Decreased endurance, Increased muscle spasms, Decreased range of motion  Visit Diagnosis: Pain in right shoulder  Stiffness of right shoulder, not elsewhere classified  Cervicalgia  Pain in thoracic spine     Problem List Patient Active Problem List   Diagnosis Date Noted  . Fatigue 09/02/2015  . Hx of adenomatous colonic polyps 09/29/2014  . Low back pain 09/29/2014  . Former smoker 09/29/2014  . Internal hemorrhoids 10/13/2012  . 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 GOITER, MULTINODULAR 01/08/2008  . ADJUSTMENT DISORDER WITH MIXED FEATURES 10/24/2006    Hettie Roselli, PTA 11/07/2015, 4:20 PM  [facility] [address] [place], [place], [address] Phone: [phone]   Fax:  [phone]  Name: Allison Campos MRN: [id number] Date of Birth: 05-Sep-1965

## 2015-11-08 ENCOUNTER — Ambulatory Visit (INDEPENDENT_AMBULATORY_CARE_PROVIDER_SITE_OTHER): Payer: BLUE CROSS/BLUE SHIELD | Admitting: Psychology

## 2015-11-08 DIAGNOSIS — F4323 Adjustment disorder with mixed anxiety and depressed mood: Secondary | ICD-10-CM | POA: Diagnosis not present
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
## 2015-11-09 ENCOUNTER — Encounter: Payer: Self-pay | Admitting: Physical Therapy

## 2015-11-09 ENCOUNTER — Ambulatory Visit: Payer: BLUE CROSS/BLUE SHIELD | Admitting: Physical Therapy

## 2015-11-09 DIAGNOSIS — M25511 Pain in right shoulder: Secondary | ICD-10-CM

## 2015-11-09 DIAGNOSIS — M25611 Stiffness of right shoulder, not elsewhere classified: Secondary | ICD-10-CM | POA: Diagnosis not present

## 2015-11-09 DIAGNOSIS — M542 Cervicalgia: Secondary | ICD-10-CM

## 2015-11-09 DIAGNOSIS — M546 Pain in thoracic spine: Secondary | ICD-10-CM | POA: Diagnosis not present

## 2015-11-09 NOTE — Therapy (Signed)
[facility] [address], [place], [place], [address] Phone: [phone]   Fax:  [phone]  Physical Therapy Treatment  Patient Details  Name: Allison Campos MRN: [id number] Date of Birth: [birth year] Referring Provider: Martinique, Betty, MD  Encounter Date: 11/09/2015      PT End of Session - 11/09/15 0801    Visit Number 7   Date for PT Re-Evaluation 12/21/15   PT Start Time 0758   PT Stop Time 0900   PT Time Calculation (min) 62 min   Activity Tolerance Patient tolerated treatment well   Behavior During Therapy [facility] for tasks assessed/performed      Past Medical History  Diagnosis Date  . HYPOTHYROIDISM, POST-RADIATION 07/19/2008  . GOITER, MULTINODULAR 01/08/2008  . 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 Hyperthyroidism   . Grave's disease   . Alcoholism in family     Past Surgical History  Procedure Laterality Date  . Shoulder arthroscopy      left  . Laparoscopy      Endometriosis  . Cesarean section      x2  . Foot surgery      to remove sewing needle    There were no vitals filed for this visit.      Subjective Assessment - 11/09/15 0801    Subjective I did very well yesterday and after MOndays session. This AM I am sore.   Currently in Pain? Yes   Pain Score 5    Pain Location Shoulder   Pain Orientation Right;Anterior   Pain Descriptors / Indicators Aching;Sore                         [facility] Adult PT Treatment/Exercise - 11/09/15 0001    Neck Exercises: Supine   Other Supine Exercise Breathing on purple ball x 1 min, then rotations 10x    Shoulder Exercises: Supine   Other Supine Exercises Supine scissor arms 10x bil, horizontal arms 10x   Other Supine Exercises Foam roll  x 1 min for diaphragmatic breating   Scapular spasm, rest in flat position helped reduce   Shoulder Exercises: Pulleys   Flexion 3 minutes   Shoulder Exercises: Stretch   Other  Shoulder Stretches Supine collar bone lengtheners 10x   Moist Heat Therapy   Number Minutes Moist Heat 15 Minutes   Moist Heat Location --  Neck and shoulder   Electrical Stimulation   Electrical Stimulation Location RT shoulder/ pectoral   Electrical Stimulation Action IFC   Electrical Stimulation Goals Pain   Manual Therapy   Manual Therapy Soft tissue mobilization  RT pesctoral, PROM RT shoulder   Soft tissue mobilization OA release with static stretching to RT cervical.   Passive ROM RT shoulkder                  PT Short Term Goals - 11/07/15 1542    PT SHORT TERM GOAL #4   Title demonstrate Rt shoulder AROM flexion to > or = to 110 degrees for reaching overhead   Time 4   Period Weeks   Status Achieved  110 degrees   PT SHORT TERM GOAL #5   Title report a 25% reduction in neck and shoulder pain with sitting and ADLs   Time 4   Period Weeks   Status On-going  20%           PT Long Term Goals - 10/27/15 [id number]  PT LONG TERM GOAL #1   Title be independent in advanced HEP   Time 8   Period Weeks   Status On-going   PT LONG TERM GOAL #2   Title reduce FOTO to < or = to 34% limitation   Time 8   Period Weeks   Status On-going   PT LONG TERM GOAL #3   Title demonstrate full cervical AROM without to improve safety with driving and ease with ADLs   Time 8   Period Weeks   Status On-going   PT LONG TERM GOAL #4   Title get dressed with < or = to 3/10 Rt shoulder pain   Time 8   Period Weeks   Status On-going   PT LONG TERM GOAL #5   Title report a 60% reduction in neck and shoulder pain with ADLs and work tasks   Time 8   Period Weeks   Status On-going   PT LONG TERM GOAL #6   Title return to regular exercise routine without limitation   Time 8   Period Weeks   Status On-going   PT LONG TERM GOAL #7   Title demonstrate full Rt shoulder AROM without significant increase in pain   Time 8   Period Weeks   Status On-going                Plan - 11/09/15 0801    Clinical Impression Statement Had some scapular spasms today while on foam roll. These reduced by laying flat. She continues to increase her AROM of RT shoulder and HA seem to be lessening as she has nothas one in 48 hours. Cervical muscles continue to increase in length and soften with less trigger points.    Rehab Potential Good   PT Frequency 3x / week   PT Duration 8 weeks   PT Treatment/Interventions ADLs/Self Care Home Management;Cryotherapy;Electrical Stimulation;Iontophoresis 4mg /ml Dexamethasone;Moist Heat;Therapeutic exercise;Therapeutic activities;Functional mobility training;Ultrasound;Neuromuscular re-education;Traction;Patient/family education;Manual techniques;Taping;Dry needling;Passive range of motion   PT Next Visit Plan Manual work to RT cervical spine, PROM RT shoulder, pulleys, release work for neck in supine with ball or foam roll.    Consulted and Agree with Plan of Care Patient      Patient will benefit from skilled therapeutic intervention in order to improve the following deficits and impairments:  Postural dysfunction, Decreased strength, Improper body mechanics, Impaired flexibility, Pain, Decreased activity tolerance, Decreased endurance, Increased muscle spasms, Decreased range of motion  Visit Diagnosis: Pain in right shoulder  Stiffness of right shoulder, not elsewhere classified  Cervicalgia  Pain in thoracic spine     Problem List Patient Active Problem List   Diagnosis Date Noted  . Fatigue 09/02/2015  . Hx of adenomatous colonic polyps 09/29/2014  . Low back pain 09/29/2014  . Former smoker 09/29/2014  . Internal hemorrhoids 10/13/2012  . 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 GOITER, MULTINODULAR 01/08/2008  . ADJUSTMENT DISORDER WITH MIXED FEATURES 10/24/2006    Maryann Mccall, PTA 11/09/2015, 8:47 AM  [facility] [address] [place], [place], [address] Phone: [phone]   Fax:  [phone]  Name: Allison Campos MRN: [id number] Date of Birth: Jun 04, 1965

## 2015-11-10 ENCOUNTER — Ambulatory Visit: Payer: BLUE CROSS/BLUE SHIELD

## 2015-11-10 DIAGNOSIS — M546 Pain in thoracic spine: Secondary | ICD-10-CM

## 2015-11-10 DIAGNOSIS — M25611 Stiffness of right shoulder, not elsewhere classified: Secondary | ICD-10-CM

## 2015-11-10 DIAGNOSIS — M542 Cervicalgia: Secondary | ICD-10-CM

## 2015-11-10 DIAGNOSIS — M25511 Pain in right shoulder: Secondary | ICD-10-CM | POA: Diagnosis not present

## 2015-11-10 NOTE — Therapy (Signed)
[facility] [address] [place], [place], [address] Phone: [phone]   Fax:  [phone]  Physical Therapy Treatment  Patient Details  Name: Allison Campos MRN: [id number] Date of Birth: 02-Sep-1965 Referring Provider: Martinique, Betty, MD, Garret Reddish, MD   Encounter Date: 11/10/2015      PT End of Session - 11/10/15 0844    Visit Number 8   Date for PT Re-Evaluation 12/21/15   PT Start Time 0800   PT Stop Time 0901   PT Time Calculation (min) 61 min   Activity Tolerance Patient tolerated treatment well   Behavior During Therapy [facility] for tasks assessed/performed      Past Medical History  Diagnosis Date  . HYPOTHYROIDISM, POST-RADIATION 07/19/2008  . GOITER, MULTINODULAR 01/08/2008  . 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 Hyperthyroidism   . Grave's disease   . Alcoholism in family     Past Surgical History  Procedure Laterality Date  . Shoulder arthroscopy      left  . Laparoscopy      Endometriosis  . Cesarean section      x2  . Foot surgery      to remove sewing needle    There were no vitals filed for this visit.      Subjective Assessment - 11/10/15 0802    Subjective Feeling dizzy today.  30% overall improvement reported.     Patient Stated Goals return to exercise, reduce pain, sit longer   Currently in Pain? Yes   Pain Score 3    Pain Location Shoulder   Pain Orientation Right;Anterior   Pain Descriptors / Indicators Aching;Sore   Pain Type Acute pain   Pain Onset 1 to 4 weeks ago   Pain Frequency Constant   Aggravating Factors  getting up in the morning, taking off shirt, doing hair   Pain Relieving Factors rest, heat            [facility] PT Assessment - 11/10/15 0001    Assessment   Medical Diagnosis Rt shoulder pain, Upper back pain, Cervicalgia   Onset Date/Surgical Date 10/22/15   Prior Function   Level of Independence Independent   Vocation Full time  employment   Engineer, manufacturing work   Leisure exercise: yoga, spin, [employer]   Cognition   Overall Cognitive Status Within Functional Limits for tasks assessed   ROM / Strength   AROM / PROM / Strength AROM;Strength   AROM   Overall AROM  Deficits   AROM Assessment Site Cervical   Cervical - Right Side Bend 40   Cervical - Left Side Bend 45   Cervical - Right Rotation 60   Cervical - Left Rotation 60   Strength   Overall Strength Deficits   Overall Strength Comments Rt shoulder flexion 4/5, Lt 4+/5, bil abduction 4/5, elbow flexion 4+/5, ER Rt 4/5, Lt 4+/5                     [facility] Adult PT Treatment/Exercise - 11/10/15 0001    Shoulder Exercises: Supine   Horizontal ABduction Strengthening;Both;10 reps;Theraband   Theraband Level (Shoulder Horizontal ABduction) Level 1 (Yellow)   Other Supine Exercises Supine for decompression x 3 minutes, Supine scissor arms 10x bil, horizontal arms 10x   Shoulder Exercises: Pulleys   Flexion 3 minutes   Shoulder Exercises: ROM/Strengthening   UBE (Upper Arm Bike) Level 0 x 4 minutes (2/2)   Moist Heat Therapy  Number Minutes Moist Heat 15 Minutes   Moist Heat Location Shoulder;Cervical   Electrical Stimulation   Electrical Stimulation Location neck and Rt shoulder   Electrical Stimulation Action IFC   Electrical Stimulation Parameters 15 minutes   Electrical Stimulation Goals Pain   Manual Therapy   Manual Therapy Soft tissue mobilization  RT pesctoral, PROM RT shoulder   Manual therapy comments elongation and trigger point release to bil neck, UT and suboccipitals                  PT Short Term Goals - 11/07/15 1542    PT SHORT TERM GOAL #4   Title demonstrate Rt shoulder AROM flexion to > or = to 110 degrees for reaching overhead   Time 4   Period Weeks   Status Achieved  110 degrees   PT SHORT TERM GOAL #5   Title report a 25% reduction in neck and shoulder pain with sitting and ADLs   Time 4    Period Weeks   Status On-going  20%           PT Long Term Goals - 10/27/15 0825    PT LONG TERM GOAL #1   Title be independent in advanced HEP   Time 8   Period Weeks   Status On-going   PT LONG TERM GOAL #2   Title reduce FOTO to < or = to 34% limitation   Time 8   Period Weeks   Status On-going   PT LONG TERM GOAL #3   Title demonstrate full cervical AROM without to improve safety with driving and ease with ADLs   Time 8   Period Weeks   Status On-going   PT LONG TERM GOAL #4   Title get dressed with < or = to 3/10 Rt shoulder pain   Time 8   Period Weeks   Status On-going   PT LONG TERM GOAL #5   Title report a 60% reduction in neck and shoulder pain with ADLs and work tasks   Time 8   Period Weeks   Status On-going   PT LONG TERM GOAL #6   Title return to regular exercise routine without limitation   Time 8   Period Weeks   Status On-going   PT LONG TERM GOAL #7   Title demonstrate full Rt shoulder AROM without significant increase in pain   Time 8   Period Weeks   Status On-going               Plan - 11/10/15 [id number]    Clinical Impression Statement Pt reports 30% overall improvement since the start of care.  She is still out of work due to pain . No headaches since Tuesday.  Pt with guarded and painful movement of the neck and shoulders.  Pt has not returned to exercise.  Pt with continued trigger points in cervical musculature and this is improving.  Pt will continue to benfit from skilled PT for mobility, flexibility and pain management.     Rehab Potential Good   PT Frequency 3x / week   PT Duration 8 weeks   PT Treatment/Interventions ADLs/Self Care Home Management;Cryotherapy;Electrical Stimulation;Iontophoresis 4mg /ml Dexamethasone;Moist Heat;Therapeutic exercise;Therapeutic activities;Functional mobility training;Ultrasound;Neuromuscular re-education;Traction;Patient/family education;Manual techniques;Taping;Dry needling;Passive range of  motion   PT Next Visit Plan Manual work to RT cervical spine, PROM RT shoulder, pulleys, release work for neck in supine with ball or foam roll.    Consulted and Agree with Plan of Care Patient  Patient will benefit from skilled therapeutic intervention in order to improve the following deficits and impairments:  Postural dysfunction, Decreased strength, Improper body mechanics, Impaired flexibility, Pain, Decreased activity tolerance, Decreased endurance, Increased muscle spasms, Decreased range of motion  Visit Diagnosis: Pain in right shoulder  Stiffness of right shoulder, not elsewhere classified  Cervicalgia  Pain in thoracic spine     Problem List Patient Active Problem List   Diagnosis Date Noted  . Fatigue 09/02/2015  . Hx of adenomatous colonic polyps 09/29/2014  . Low back pain 09/29/2014  . Former smoker 09/29/2014  . Internal hemorrhoids 10/13/2012  . 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 GOITER, MULTINODULAR 01/08/2008  . ADJUSTMENT DISORDER WITH MIXED FEATURES 10/24/2006     Sigurd Sos, PT 11/10/2015 8:46 AM  [facility] [address] [place], [place], [address] Phone: [phone]   Fax:  [phone]  Name: Jazzelle Erle MRN: [id number] Date of Birth: 1965/10/29

## 2015-11-11 ENCOUNTER — Telehealth: Payer: Self-pay

## 2015-11-11 ENCOUNTER — Ambulatory Visit: Payer: BLUE CROSS/BLUE SHIELD | Admitting: Family Medicine
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
## 2015-11-13 NOTE — Telephone Encounter (Signed)
Yes, I am ok with gradual progression as stated in your note Allison Campos- seems reasonable given her improvement

## 2015-11-14 ENCOUNTER — Ambulatory Visit: Payer: BLUE CROSS/BLUE SHIELD | Admitting: Physical Therapy

## 2015-11-14 ENCOUNTER — Encounter: Payer: Self-pay | Admitting: Physical Therapy

## 2015-11-14 DIAGNOSIS — M25611 Stiffness of right shoulder, not elsewhere classified: Secondary | ICD-10-CM

## 2015-11-14 DIAGNOSIS — M542 Cervicalgia: Secondary | ICD-10-CM | POA: Diagnosis not present

## 2015-11-14 DIAGNOSIS — M25511 Pain in right shoulder: Secondary | ICD-10-CM | POA: Diagnosis not present

## 2015-11-14 DIAGNOSIS — M546 Pain in thoracic spine: Secondary | ICD-10-CM | POA: Diagnosis not present
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
## 2015-11-16 ENCOUNTER — Ambulatory Visit: Payer: BLUE CROSS/BLUE SHIELD | Admitting: Physical Therapy

## 2015-11-16 ENCOUNTER — Encounter: Payer: Self-pay | Admitting: Physical Therapy

## 2015-11-16 DIAGNOSIS — M546 Pain in thoracic spine: Secondary | ICD-10-CM

## 2015-11-16 DIAGNOSIS — M25611 Stiffness of right shoulder, not elsewhere classified: Secondary | ICD-10-CM

## 2015-11-16 DIAGNOSIS — M25511 Pain in right shoulder: Secondary | ICD-10-CM | POA: Diagnosis not present

## 2015-11-16 DIAGNOSIS — M542 Cervicalgia: Secondary | ICD-10-CM
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
## 2015-11-16 NOTE — Telephone Encounter (Signed)
Spoke with patient and she is aware to increase her activity as tolerated. She is scheduled for follow up this Friday.

## 2015-11-17 ENCOUNTER — Ambulatory Visit: Payer: BLUE CROSS/BLUE SHIELD | Admitting: Family Medicine

## 2015-11-18 ENCOUNTER — Ambulatory Visit (INDEPENDENT_AMBULATORY_CARE_PROVIDER_SITE_OTHER): Payer: BLUE CROSS/BLUE SHIELD | Admitting: Family Medicine

## 2015-11-18 ENCOUNTER — Encounter: Payer: Self-pay | Admitting: Physical Therapy

## 2015-11-18 ENCOUNTER — Ambulatory Visit: Payer: BLUE CROSS/BLUE SHIELD | Admitting: Physical Therapy

## 2015-11-18 ENCOUNTER — Encounter: Payer: Self-pay | Admitting: Family Medicine

## 2015-11-18 VITALS — BP 114/82 | HR 68 | Temp 98.9°F | Ht 64.0 in | Wt 155.0 lb

## 2015-11-18 DIAGNOSIS — F0781 Postconcussional syndrome: Secondary | ICD-10-CM

## 2015-11-18 DIAGNOSIS — R41840 Attention and concentration deficit: Secondary | ICD-10-CM

## 2015-11-18 DIAGNOSIS — M542 Cervicalgia: Secondary | ICD-10-CM

## 2015-11-18 DIAGNOSIS — M25511 Pain in right shoulder: Secondary | ICD-10-CM

## 2015-11-18 DIAGNOSIS — M546 Pain in thoracic spine: Secondary | ICD-10-CM

## 2015-11-18 DIAGNOSIS — M25611 Stiffness of right shoulder, not elsewhere classified: Secondary | ICD-10-CM

## 2015-11-18 NOTE — Therapy (Signed)
[facility] [address], [place], [place], [address] Phone: [phone]   Fax:  [phone]  Physical Therapy Treatment  Patient Details  Name: Allison Campos MRN: [id number] Date of Birth: Aug 27, 1965 Referring Provider: Dr. Betty Martinique  Encounter Date: 11/18/2015      PT End of Session - 11/18/15 0927    Visit Number 11   Date for PT Re-Evaluation 12/21/15   PT Start Time 0845   PT Stop Time 0948   PT Time Calculation (min) 63 min   Activity Tolerance Patient tolerated treatment well   Behavior During Therapy [facility] for tasks assessed/performed      Past Medical History  Diagnosis Date  . HYPOTHYROIDISM, POST-RADIATION 07/19/2008  . GOITER, MULTINODULAR 01/08/2008  . 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 Hyperthyroidism   . Grave's disease   . Alcoholism in family     Past Surgical History  Procedure Laterality Date  . Shoulder arthroscopy      left  . Laparoscopy      Endometriosis  . Cesarean section      x2  . Foot surgery      to remove sewing needle    There were no vitals filed for this visit.      Subjective Assessment - 11/18/15 0849    Subjective I see the doctor today at 1.  Headaches are better.  I did not wake up this morning with a headache.     Limitations Sitting   How long can you sit comfortably? 60-30 min   How long can you walk comfortably? 10 min. ; unable to walk after a full day at work.    Diagnostic tests CT and x-ray: all clear   Patient Stated Goals return to exercise, reduce pain, sit longer   Currently in Pain? Yes   Pain Score 3    Pain Location Shoulder   Pain Orientation Right   Pain Descriptors / Indicators Constant;Aching   Pain Onset More than a month ago   Pain Frequency Constant   Aggravating Factors  reaching overhead.  while walking around has a constant ache   Pain Relieving Factors rest, heat   Multiple Pain Sites No             [facility] PT Assessment - 11/18/15 0001    Assessment   Medical Diagnosis Rt shoulder pain, Upper back pain, Cervicalgia   Referring Provider Dr. Betty Martinique   Onset Date/Surgical Date 10/22/15   [place] residence   Prior Function   Level of Independence Independent   Vocation Full time employment   Vocation Requirements desk work   Leisure exercise: yoga, spin, [employer]   Cognition   Overall Cognitive Status Within Functional Limits for tasks assessed   Observation/Other Assessments   Focus on Therapeutic Outcomes (FOTO)  61% limitation   AROM   Cervical - Right Side Bend 45   Cervical - Left Side Bend 45   Cervical - Right Rotation 60   Cervical - Left Rotation 60   Strength   Overall Strength Comments Rt shoulder flexion 4/5, Lt 5/5, bil abduction 4/5, elbow flexion 5/5, ER Rt 4/5, Lt 4+/5                     [facility] Adult PT Treatment/Exercise - 11/18/15 0001    Neck Exercises: Supine   Neck Retraction 5 reps;5 secs  pressing head and shoulder press into  green ball   Capital Flexion 10 reps  head on green physioball, AAROM   Cervical Rotation 10 reps;Left;Right  head on green physioball, assistance at endrange   Shoulder Exercises: Supine   Other Supine Exercises OA release over small towel roll 2 x 10   Other Supine Exercises AROM into ER bil, flexion to pt tolerance, with B knees to Lt side   Shoulder Exercises: Pulleys   Flexion 3 minutes   Modalities   Modalities Electrical Stimulation;Moist Heat   Moist Heat Therapy   Number Minutes Moist Heat 20 Minutes   Moist Heat Location Shoulder;Cervical  anterior   Electrical Stimulation   Electrical Stimulation Location neck and Rt shoulder   Electrical Stimulation Action IFC   Electrical Stimulation Parameters 20   Electrical Stimulation Goals Pain   Manual Therapy   Manual Therapy Joint mobilization;Passive ROM;Soft tissue mobilization   Joint Mobilization bil.  shoulder anterior glide, inferior glide, distraction grade 3 to incresae endrange shoulder flexion and abduction; anterior glide and rotational glide of T-T5 ti increase shoulder ROM and cervical ROM   Soft tissue mobilization bil. subscapularis and anterior portion of scapula   Passive ROM right shoulder flexion bil. for endrange                 PT Education - 11/18/15 [id number]    Education provided No          PT Short Term Goals - 11/16/15 0848    PT SHORT TERM GOAL #5   Title report a 25% reduction in neck and shoulder pain with sitting and ADLs   Time 4   Period Weeks   Status Achieved           PT Long Term Goals - 11/18/15 [id number]    PT LONG TERM GOAL #1   Title be independent in advanced HEP   Time 8   Period Weeks   Status On-going  still learining   PT LONG TERM GOAL #2   Title reduce FOTO to < or = to 34% limitation   Time 8   Period Weeks   Status On-going   PT LONG TERM GOAL #3   Title demonstrate full cervical AROM without to improve safety with driving and ease with ADLs   Time 8   Period Weeks   Status On-going   PT LONG TERM GOAL #4   Title get dressed with < or = to 3/10 Rt shoulder pain   Time 8   Period Weeks   Status On-going  7/10   PT LONG TERM GOAL #5   Title report a 60% reduction in neck and shoulder pain with ADLs and work tasks   Time 8   Period Weeks   Status On-going  30% better   PT LONG TERM GOAL #6   Title return to regular exercise routine without limitation   Time 8   Period Weeks   Status On-going   PT LONG TERM GOAL #7   Title demonstrate full Rt shoulder AROM without significant increase in pain   Time 8   Period Weeks   Status On-going               Plan - 11/18/15 [id number]    Clinical Impression Statement Patient has limited bil. shoulder flexion and abduction passively due to tightness in shoulder joint and soft tissue. Decreased mobility of T1-T5 restricting shoulder and cervical motion. Patient is able  to work for 7 hours with rests and laying  down.  Patient is doing more activitys but has difficulty putting on tops and reaching overhead.  Patient has constant pain.  Patient has headaches several times per week. .  Patient will benefit from physical therapy to reduce pain , increase ROM and improve functional mobility.    Rehab Potential Good   Clinical Impairments Affecting Rehab Potential None   PT Frequency 3x / week   PT Duration 8 weeks   PT Treatment/Interventions ADLs/Self Care Home Management;Cryotherapy;Electrical Stimulation;Iontophoresis 4mg /ml Dexamethasone;Moist Heat;Therapeutic exercise;Therapeutic activities;Functional mobility training;Ultrasound;Neuromuscular re-education;Traction;Patient/family education;Manual techniques;Taping;Dry needling;Passive range of motion   PT Next Visit Plan Manual work to RT cervical spine and shoulder girdle, PROM RT shoulder, pulleys, release work for neck in supine with ball or foam roll.    PT Home Exercise Plan cervical retraction in supine with shoulder flexion    Consulted and Agree with Plan of Care Patient      Patient will benefit from skilled therapeutic intervention in order to improve the following deficits and impairments:  Postural dysfunction, Decreased strength, Improper body mechanics, Impaired flexibility, Pain, Decreased activity tolerance, Decreased endurance, Increased muscle spasms, Decreased range of motion  Visit Diagnosis: Pain in right shoulder  Stiffness of right shoulder, not elsewhere classified  Cervicalgia  Pain in thoracic spine     Problem List Patient Active Problem List   Diagnosis Date Noted  . Fatigue 09/02/2015  . Hx of adenomatous colonic polyps 09/29/2014  . Low back pain 09/29/2014  . Former smoker 09/29/2014  . Internal hemorrhoids 10/13/2012  . 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 GOITER, MULTINODULAR 01/08/2008  . ADJUSTMENT DISORDER WITH MIXED FEATURES 10/24/2006    Earlie Counts, PT 11/18/2015 9:31 AM    [facility] [address], [place], [place], [address] Phone: [phone]   Fax:  [phone]  Name: Allison Campos MRN: [id number] Date of Birth: 05-16-1966

## 2015-11-18 NOTE — Progress Notes (Signed)
Pre visit review using our clinic review tool, if applicable. No additional management support is needed unless otherwise documented below in the visit note. 

## 2015-11-18 NOTE — Patient Instructions (Signed)
You are much improved  You have done a fantastic job of gradually increasing activities without causing undue strain- continue gradual progression at 15 minute to 1 hour intervals max.
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
## 2015-11-21 ENCOUNTER — Ambulatory Visit: Payer: BLUE CROSS/BLUE SHIELD | Admitting: Physical Therapy

## 2015-11-21 ENCOUNTER — Encounter: Payer: Self-pay | Admitting: Physical Therapy

## 2015-11-21 DIAGNOSIS — M546 Pain in thoracic spine: Secondary | ICD-10-CM

## 2015-11-21 DIAGNOSIS — M25511 Pain in right shoulder: Secondary | ICD-10-CM | POA: Diagnosis not present

## 2015-11-21 DIAGNOSIS — M542 Cervicalgia: Secondary | ICD-10-CM

## 2015-11-21 DIAGNOSIS — M25611 Stiffness of right shoulder, not elsewhere classified: Secondary | ICD-10-CM | POA: Diagnosis not present

## 2015-11-21 NOTE — Therapy (Signed)
[facility] [address], [place], [place], [address] Phone: [phone]   Fax:  [phone]  Physical Therapy Treatment  Patient Details  Name: Allison Campos MRN: [id number] Date of Birth: 05/17/1966 Referring Provider: Dr. Betty Martinique  Encounter Date: 11/21/2015      PT End of Session - 11/21/15 0808    Visit Number 12   Date for PT Re-Evaluation 12/21/15   PT Start Time 0805   PT Stop Time 0905   PT Time Calculation (min) 60 min   Activity Tolerance Patient tolerated treatment well   Behavior During Therapy [facility] for tasks assessed/performed      Past Medical History  Diagnosis Date  . HYPOTHYROIDISM, POST-RADIATION 07/19/2008  . GOITER, MULTINODULAR 01/08/2008  . 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 Hyperthyroidism   . Grave's disease   . Alcoholism in family     Past Surgical History  Procedure Laterality Date  . Shoulder arthroscopy      left  . Laparoscopy      Endometriosis  . Cesarean section      x2  . Foot surgery      to remove sewing needle    There were no vitals filed for this visit.      Subjective Assessment - 11/21/15 0808    Subjective Saw MD Friday: MD pleased, no restrictions just get back into normal exercise gradually. Reports some lightheadedness in exercises at gym, reports she plans to stick to walking more.    Currently in Pain? Yes   Pain Score 2    Pain Location Shoulder   Pain Orientation Right   Pain Descriptors / Indicators Dull   Aggravating Factors  Overdoing exercises   Pain Relieving Factors Massage                         [facility] Adult PT Treatment/Exercise - 11/21/15 0001    Shoulder Exercises: Supine   Horizontal ABduction AROM;10 reps  3/4 ROM   Flexion --  Cane flexion: 10x   Shoulder Exercises: Standing   Flexion --  Cones to second shelf: 4 cones 3x up & down   Shoulder Exercises: Pulleys   Flexion 3 minutes    ABduction 3 minutes   Moist Heat Therapy   Number Minutes Moist Heat 15 Minutes   Moist Heat Location Shoulder;Cervical  anterior   Electrical Stimulation   Electrical Stimulation Location neck and Rt shoulder   Electrical Stimulation Action IFC   Electrical Stimulation Parameters 15   Electrical Stimulation Goals Pain   Manual Therapy   Manual Therapy Joint mobilization;Soft tissue mobilization;Myofascial release;Passive ROM   Joint Mobilization Cervical   Myofascial Release Cervical   Passive ROM All motions/planes                  PT Short Term Goals - 11/16/15 0848    PT SHORT TERM GOAL #5   Title report a 25% reduction in neck and shoulder pain with sitting and ADLs   Time 4   Period Weeks   Status Achieved           PT Long Term Goals - 11/21/15 0844    PT LONG TERM GOAL #1   Title be independent in advanced HEP   Time 8   Period Weeks   Status On-going   PT LONG TERM GOAL #4   Title get dressed with < or = to 3/10 Rt shoulder  pain   Time 8   Period Weeks   Status On-going  6/10               Plan - 11/21/15 0808    Rehab Potential Good   Clinical Impairments Affecting Rehab Potential None   PT Frequency 3x / week   PT Duration 8 weeks   PT Treatment/Interventions ADLs/Self Care Home Management;Cryotherapy;Electrical Stimulation;Iontophoresis 4mg /ml Dexamethasone;Moist Heat;Therapeutic exercise;Therapeutic activities;Functional mobility training;Ultrasound;Neuromuscular re-education;Traction;Patient/family education;Manual techniques;Taping;Dry needling;Passive range of motion   Consulted and Agree with Plan of Care Patient      Patient will benefit from skilled therapeutic intervention in order to improve the following deficits and impairments:  Postural dysfunction, Decreased strength, Improper body mechanics, Impaired flexibility, Pain, Decreased activity tolerance, Decreased endurance, Increased muscle spasms, Decreased range of  motion  Visit Diagnosis: Pain in right shoulder  Stiffness of right shoulder, not elsewhere classified  Cervicalgia  Pain in thoracic spine     Problem List Patient Active Problem List   Diagnosis Date Noted  . Fatigue 09/02/2015  . Hx of adenomatous colonic polyps 09/29/2014  . Low back pain 09/29/2014  . Former smoker 09/29/2014  . Internal hemorrhoids 10/13/2012  . 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 GOITER, MULTINODULAR 01/08/2008  . ADJUSTMENT DISORDER WITH MIXED FEATURES 10/24/2006    Allison Campos,PTA 11/21/2015, 8:46 AM  [facility] [address] [place], [place], [address] Phone: [phone]   Fax:  [phone]  Name: Allison Campos MRN: [id number] Date of Birth: 05/09/1966

## 2015-11-22 ENCOUNTER — Ambulatory Visit (INDEPENDENT_AMBULATORY_CARE_PROVIDER_SITE_OTHER): Payer: BLUE CROSS/BLUE SHIELD | Admitting: Psychology

## 2015-11-22 ENCOUNTER — Encounter: Payer: Self-pay | Admitting: Family Medicine

## 2015-11-22 DIAGNOSIS — F4323 Adjustment disorder with mixed anxiety and depressed mood: Secondary | ICD-10-CM | POA: Diagnosis not present
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
## 2015-11-22 NOTE — Assessment & Plan Note (Signed)
Based on her report, she seems improved from last meeting.  She is back to work with a modified schedule.  She is back to exercising with some modifications.  Her thought process seems clear and positive.  I am very impressed with how flexible she is with her recovery.  She is an Tax inspector and historically, very self-critical.  This does not appear to be the case here.  PHQ-9 was 6; three points attributed to difficulty concentrating which she ties to her concussion recovery.  Will follow as needed.

## 2015-11-23 ENCOUNTER — Ambulatory Visit: Payer: BLUE CROSS/BLUE SHIELD | Admitting: Physical Therapy

## 2015-11-23 ENCOUNTER — Encounter: Payer: Self-pay | Admitting: Physical Therapy

## 2015-11-23 DIAGNOSIS — M25511 Pain in right shoulder: Secondary | ICD-10-CM | POA: Diagnosis not present

## 2015-11-23 DIAGNOSIS — M25611 Stiffness of right shoulder, not elsewhere classified: Secondary | ICD-10-CM

## 2015-11-23 DIAGNOSIS — M546 Pain in thoracic spine: Secondary | ICD-10-CM

## 2015-11-23 DIAGNOSIS — M542 Cervicalgia: Secondary | ICD-10-CM

## 2015-11-23 NOTE — Therapy (Signed)
[facility] [address] [place], [place], [address] Phone: [phone]   Fax:  [phone]  Physical Therapy Treatment  Patient Details  Name: Allison Campos MRN: [id number] Date of Birth: 1966/04/24 Referring Provider: Dr. Betty Martinique  Encounter Date: 11/23/2015      PT End of Session - 11/23/15 0805    Visit Number 13   Date for PT Re-Evaluation 12/21/15   PT Start Time 0800   PT Stop Time 0900   PT Time Calculation (min) 60 min   Activity Tolerance Patient tolerated treatment well   Behavior During Therapy [facility] for tasks assessed/performed      Past Medical History  Diagnosis Date  . HYPOTHYROIDISM, POST-RADIATION 07/19/2008  . GOITER, MULTINODULAR 01/08/2008  . 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 Hyperthyroidism   . Grave's disease   . Alcoholism in family     Past Surgical History  Procedure Laterality Date  . Shoulder arthroscopy      left  . Laparoscopy      Endometriosis  . Cesarean section      x2  . Foot surgery      to remove sewing needle    There were no vitals filed for this visit.      Subjective Assessment - 11/23/15 0803    Subjective yesterday was a good day, did a short walk this AM and got lightheaded so she turned around and went home. She has been discussing all this with the MD.    Currently in Pain? Yes   Pain Score 3    Pain Location Neck   Pain Descriptors / Indicators Aching  tight/stiff   Multiple Pain Sites No            [facility] PT Assessment - 11/23/15 0001    AROM   Overall AROM Comments RT shld flexion: 140 degrees standing                     [facility] Adult PT Treatment/Exercise - 11/23/15 0001    Shoulder Exercises: Supine   Other Supine Exercises Foam roll: decompression x 2 min, 4 snow angels 4x    Shoulder Exercises: Standing   Flexion --  cones (5) to second shelf 3x   Shoulder Exercises: Pulleys   Flexion 3 minutes    ABduction 3 minutes   Shoulder Exercises: ROM/Strengthening   UBE (Upper Arm Bike) L1 2 x 2   Shoulder Exercises: Stretch   Corner Stretch --  Doorway stretch 3 x 20 sec. VC on breathing/relaxing   Moist Heat Therapy   Number Minutes Moist Heat 15 Minutes   Moist Heat Location Shoulder;Cervical  anterior   Electrical Stimulation   Electrical Stimulation Location neck and Rt shoulder   Electrical Stimulation Action IFC   Electrical Stimulation Parameters 15   Electrical Stimulation Goals Pain   Manual Therapy   Soft tissue mobilization Cervical/OA   Myofascial Release Cervical   Passive ROM All motions/planes                  PT Short Term Goals - 11/16/15 0848    PT SHORT TERM GOAL #5   Title report a 25% reduction in neck and shoulder pain with sitting and ADLs   Time 4   Period Weeks   Status Achieved           PT Long Term Goals - 11/21/15 0844    PT LONG TERM GOAL #1  Title be independent in advanced HEP   Time 8   Period Weeks   Status On-going   PT LONG TERM GOAL #4   Title get dressed with < or = to 3/10 Rt shoulder pain   Time 8   Period Weeks   Status On-going  6/10               Plan - 11/23/15 0806    Clinical Impression Statement RT shoulder muscles spasm during ROM exercises that are more active vs AA. Pt has increased her RT shoulder AROm to 140 degrees today. She also reports her headaches are rare at this time.    Rehab Potential Good   Clinical Impairments Affecting Rehab Potential None   PT Frequency 3x / week   PT Duration 8 weeks   PT Treatment/Interventions ADLs/Self Care Home Management;Cryotherapy;Electrical Stimulation;Iontophoresis 4mg /ml Dexamethasone;Moist Heat;Therapeutic exercise;Therapeutic activities;Functional mobility training;Ultrasound;Neuromuscular re-education;Traction;Patient/family education;Manual techniques;Taping;Dry needling;Passive range of motion   PT Next Visit Plan Manual work to RT cervical  spine and shoulder girdle, PROM RT shoulder, pulleys, release work for neck in supine with ball or foam roll.    Consulted and Agree with Plan of Care Patient      Patient will benefit from skilled therapeutic intervention in order to improve the following deficits and impairments:  Postural dysfunction, Decreased strength, Improper body mechanics, Impaired flexibility, Pain, Decreased activity tolerance, Decreased endurance, Increased muscle spasms, Decreased range of motion  Visit Diagnosis: Pain in right shoulder  Stiffness of right shoulder, not elsewhere classified  Cervicalgia  Pain in thoracic spine     Problem List Patient Active Problem List   Diagnosis Date Noted  . Fatigue 09/02/2015  . Hx of adenomatous colonic polyps 09/29/2014  . Low back pain 09/29/2014  . Former smoker 09/29/2014  . Internal hemorrhoids 10/13/2012  . 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 GOITER, MULTINODULAR 01/08/2008  . ADJUSTMENT DISORDER WITH MIXED FEATURES 10/24/2006    Allison Campos, PTA 11/23/2015, 8:50 AM  [facility] [address], [place], [place], [address] Phone: [phone]   Fax:  [phone]  Name: Allison Campos MRN: [id number] Date of Birth: 20-Apr-1966

## 2015-11-24 ENCOUNTER — Other Ambulatory Visit (INDEPENDENT_AMBULATORY_CARE_PROVIDER_SITE_OTHER): Payer: BLUE CROSS/BLUE SHIELD

## 2015-11-24 DIAGNOSIS — E038 Other specified hypothyroidism: Secondary | ICD-10-CM

## 2015-11-24 LAB — TSH: TSH: 2.31 u[IU]/mL (ref 0.35–4.50)

## 2015-11-25 ENCOUNTER — Ambulatory Visit: Payer: BLUE CROSS/BLUE SHIELD | Admitting: Physical Therapy

## 2015-11-25 ENCOUNTER — Encounter: Payer: Self-pay | Admitting: Physical Therapy

## 2015-11-25 DIAGNOSIS — M546 Pain in thoracic spine: Secondary | ICD-10-CM

## 2015-11-25 DIAGNOSIS — M25611 Stiffness of right shoulder, not elsewhere classified: Secondary | ICD-10-CM | POA: Diagnosis not present

## 2015-11-25 DIAGNOSIS — M25511 Pain in right shoulder: Secondary | ICD-10-CM

## 2015-11-25 DIAGNOSIS — M542 Cervicalgia: Secondary | ICD-10-CM | POA: Diagnosis not present

## 2015-11-25 NOTE — Therapy (Signed)
[facility] [address] [place], [place], [address] Phone: [phone]   Fax:  [phone]  Physical Therapy Treatment  Patient Details  Name: Allison Campos MRN: [id number] Date of Birth: 11-23-1965 Referring Provider: Dr. Betty Martinique  Encounter Date: 11/25/2015      PT End of Session - 11/25/15 0853    Visit Number 14   Date for PT Re-Evaluation 12/21/15   PT Start Time 0848   PT Stop Time 0945   PT Time Calculation (min) 57 min   Activity Tolerance Patient tolerated treatment well   Behavior During Therapy [facility] for tasks assessed/performed      Past Medical History  Diagnosis Date  . HYPOTHYROIDISM, POST-RADIATION 07/19/2008  . GOITER, MULTINODULAR 01/08/2008  . 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 Hyperthyroidism   . Grave's disease   . Alcoholism in family     Past Surgical History  Procedure Laterality Date  . Shoulder arthroscopy      left  . Laparoscopy      Endometriosis  . Cesarean section      x2  . Foot surgery      to remove sewing needle    There were no vitals filed for this visit.      Subjective Assessment - 11/25/15 0850    Subjective I'm putting away light things in the kitchen more, lightheadeness is slightly better had some HA yesterday.    Currently in Pain? No/denies   Multiple Pain Sites No                         [facility] Adult PT Treatment/Exercise - 11/25/15 0001    Shoulder Exercises: Standing   Flexion --  cones (5) to second shelf 3x   Other Standing Exercises Finger ladder 10x   reaches #20   Shoulder Exercises: Pulleys   Flexion 3 minutes   ABduction 3 minutes   Shoulder Exercises: ROM/Strengthening   UBE (Upper Arm Bike) L1 2 x 2   Shoulder Exercises: Stretch   Corner Stretch --  Doorway stretch 3 x 20 sec. VC on breathing/relaxing   Moist Heat Therapy   Number Minutes Moist Heat 15 Minutes   Moist Heat Location  Shoulder;Cervical  anterior   Electrical Stimulation   Electrical Stimulation Location neck and Rt shoulder   Electrical Stimulation Action IFC   Electrical Stimulation Goals Pain   Manual Therapy   Soft tissue mobilization Cervical/OA  Static stretching to cervical for rotation and sidebending   Myofascial Release Cervical                  PT Short Term Goals - 11/16/15 0848    PT SHORT TERM GOAL #5   Title report a 25% reduction in neck and shoulder pain with sitting and ADLs   Time 4   Period Weeks   Status Achieved           PT Long Term Goals - 11/21/15 0844    PT LONG TERM GOAL #1   Title be independent in advanced HEP   Time 8   Period Weeks   Status On-going   PT LONG TERM GOAL #4   Title get dressed with < or = to 3/10 Rt shoulder pain   Time 8   Period Weeks   Status On-going  6/10               Plan - 11/25/15 [id number]  Clinical Impression Statement Headaches continue to lessen, pt reports she is using her RT arm during the day. She cannot lift anything heavy at this time. She is still at 7 hours for work day.    Rehab Potential Good   Clinical Impairments Affecting Rehab Potential None   PT Frequency 3x / week   PT Duration 8 weeks   PT Treatment/Interventions ADLs/Self Care Home Management;Cryotherapy;Electrical Stimulation;Iontophoresis 4mg /ml Dexamethasone;Moist Heat;Therapeutic exercise;Therapeutic activities;Functional mobility training;Ultrasound;Neuromuscular re-education;Traction;Patient/family education;Manual techniques;Taping;Dry needling;Passive range of motion   PT Next Visit Plan Manual work to RT cervical spine and shoulder girdle, PROM RT shoulder, pulleys, consider adding 1# to AA shoulder exs and/or band work for scap strength.   Consulted and Agree with Plan of Care Patient      Patient will benefit from skilled therapeutic intervention in order to improve the following deficits and impairments:  Postural dysfunction,  Decreased strength, Improper body mechanics, Impaired flexibility, Pain, Decreased activity tolerance, Decreased endurance, Increased muscle spasms, Decreased range of motion  Visit Diagnosis: Pain in right shoulder  Stiffness of right shoulder, not elsewhere classified  Cervicalgia  Pain in thoracic spine     Problem List Patient Active Problem List   Diagnosis Date Noted  . Fatigue 09/02/2015  . Hx of adenomatous colonic polyps 09/29/2014  . Low back pain 09/29/2014  . Former smoker 09/29/2014  . Internal hemorrhoids 10/13/2012  . 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 GOITER, MULTINODULAR 01/08/2008  . ADJUSTMENT DISORDER WITH MIXED FEATURES 10/24/2006    Paw Karstens, PTA 11/25/2015, 9:32 AM  [facility] [address], [place], [place], [address] Phone: [phone]   Fax:  [phone]  Name: Rocsi Jessel MRN: [id number] Date of Birth: [birth year]

## 2015-11-28 ENCOUNTER — Encounter: Payer: Self-pay | Admitting: Physical Therapy

## 2015-11-28 ENCOUNTER — Ambulatory Visit: Payer: BLUE CROSS/BLUE SHIELD | Attending: Family Medicine | Admitting: Physical Therapy

## 2015-11-28 DIAGNOSIS — M25511 Pain in right shoulder: Secondary | ICD-10-CM | POA: Diagnosis not present

## 2015-11-28 DIAGNOSIS — M542 Cervicalgia: Secondary | ICD-10-CM | POA: Diagnosis not present

## 2015-11-28 DIAGNOSIS — M546 Pain in thoracic spine: Secondary | ICD-10-CM | POA: Diagnosis not present

## 2015-11-28 DIAGNOSIS — M25611 Stiffness of right shoulder, not elsewhere classified: Secondary | ICD-10-CM | POA: Insufficient documentation
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
## 2015-12-02 ENCOUNTER — Ambulatory Visit: Payer: BLUE CROSS/BLUE SHIELD | Admitting: Physical Therapy

## 2015-12-02 ENCOUNTER — Encounter: Payer: Self-pay | Admitting: Physical Therapy

## 2015-12-02 DIAGNOSIS — M542 Cervicalgia: Secondary | ICD-10-CM

## 2015-12-02 DIAGNOSIS — M25511 Pain in right shoulder: Secondary | ICD-10-CM | POA: Diagnosis not present

## 2015-12-02 DIAGNOSIS — M546 Pain in thoracic spine: Secondary | ICD-10-CM | POA: Diagnosis not present

## 2015-12-02 DIAGNOSIS — M25611 Stiffness of right shoulder, not elsewhere classified: Secondary | ICD-10-CM

## 2015-12-02 NOTE — Therapy (Signed)
[facility] [address] [place], [place], [address] Phone: [phone]   Fax:  [phone]  Physical Therapy Treatment  Patient Details  Name: Allison Campos MRN: [id number] Date of Birth: 1965/12/17 Referring Provider: Dr. Betty Martinique  Encounter Date: 12/02/2015      PT End of Session - 12/02/15 0805    Visit Number 16   Date for PT Re-Evaluation 12/21/15   PT Start Time 0758   PT Stop Time 0900   PT Time Calculation (min) 62 min   Activity Tolerance Patient tolerated treatment well   Behavior During Therapy [facility] for tasks assessed/performed      Past Medical History  Diagnosis Date  . HYPOTHYROIDISM, POST-RADIATION 07/19/2008  . GOITER, MULTINODULAR 01/08/2008  . 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 Hyperthyroidism   . Grave's disease   . Alcoholism in family     Past Surgical History  Procedure Laterality Date  . Shoulder arthroscopy      left  . Laparoscopy      Endometriosis  . Cesarean section      x2  . Foot surgery      to remove sewing needle    There were no vitals filed for this visit.      Subjective Assessment - 12/02/15 0806    Subjective Drove a lot yesterday and I had a killer HA this AM, it is lessening with time.  My grip is getting better slowly and I did stationary bike at gym the other day for 30 min.   Currently in Pain? Yes   Pain Score 2    Pain Location Shoulder   Pain Orientation Right   Pain Descriptors / Indicators Dull   Aggravating Factors  A lot of driving   Pain Relieving Factors Exercises   Multiple Pain Sites No                         [facility] Adult PT Treatment/Exercise - 12/02/15 0001    Shoulder Exercises: Standing   Other Standing Exercises Finger ladder 10x   reaches #20   Other Standing Exercises yellow rockwood 10x each   Shoulder Exercises: Pulleys   Flexion 3 minutes   ABduction 3 minutes   Shoulder Exercises:  ROM/Strengthening   UBE (Upper Arm Bike) L2 3x3  Sitting on green ball   Moist Heat Therapy   Number Minutes Moist Heat 15 Minutes   Moist Heat Location Shoulder;Cervical   Electrical Stimulation   Electrical Stimulation Location neck and shoulder   Electrical Stimulation Action IFC   Electrical Stimulation Parameters 15   Electrical Stimulation Goals Pain   Manual Therapy   Soft tissue mobilization cervical, scapula   Myofascial Release cervical   Passive ROM neck                  PT Short Term Goals - 11/16/15 0848    PT SHORT TERM GOAL #5   Title report a 25% reduction in neck and shoulder pain with sitting and ADLs   Time 4   Period Weeks   Status Achieved           PT Long Term Goals - 11/28/15 0840    PT LONG TERM GOAL #1   Title be independent in advanced HEP   Time 8   Period Weeks   Status On-going   PT LONG TERM GOAL #2   Title reduce FOTO to < or =  to 34% limitation   Time 8   Period Weeks   Status On-going   PT LONG TERM GOAL #3   Title demonstrate full cervical AROM without to improve safety with driving and ease with ADLs   Time 8   Period Weeks   Status On-going   PT LONG TERM GOAL #4   Title get dressed with < or = to 3/10 Rt shoulder pain   Time 8   Period Weeks   Status On-going   PT LONG TERM GOAL #5   Title report a 60% reduction in neck and shoulder pain with ADLs and work tasks   Time 8   Period Weeks   Status On-going   PT LONG TERM GOAL #6   Title return to regular exercise routine without limitation   Time 8   Period Weeks   Status On-going   PT LONG TERM GOAL #7   Title demonstrate full Rt shoulder AROM without significant increase in pain   Time 8   Period Weeks   Status On-going               Plan - 12/02/15 0846    Clinical Impression Statement Pt is driving more, doing more cardio exercise at the gym typically with an increased HA and dull shoulder pain.     Rehab Potential Good   Clinical  Impairments Affecting Rehab Potential None   PT Frequency 3x / week   PT Duration 8 weeks   PT Treatment/Interventions ADLs/Self Care Home Management;Cryotherapy;Electrical Stimulation;Iontophoresis 4mg /ml Dexamethasone;Moist Heat;Therapeutic exercise;Therapeutic activities;Functional mobility training;Ultrasound;Neuromuscular re-education;Traction;Patient/family education;Manual techniques;Taping;Dry needling;Passive range of motion   PT Next Visit Plan Yellow rockwood for HEP if ok.    Consulted and Agree with Plan of Care Patient      Patient will benefit from skilled therapeutic intervention in order to improve the following deficits and impairments:  Postural dysfunction, Decreased strength, Improper body mechanics, Impaired flexibility, Pain, Decreased activity tolerance, Decreased endurance, Increased muscle spasms, Decreased range of motion  Visit Diagnosis: Pain in right shoulder  Stiffness of right shoulder, not elsewhere classified  Cervicalgia  Pain in thoracic spine     Problem List Patient Active Problem List   Diagnosis Date Noted  . Fatigue 09/02/2015  . Hx of adenomatous colonic polyps 09/29/2014  . Low back pain 09/29/2014  . Former smoker 09/29/2014  . Internal hemorrhoids 10/13/2012  . 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 GOITER, MULTINODULAR 01/08/2008  . ADJUSTMENT DISORDER WITH MIXED FEATURES 10/24/2006    Emylee Decelle, PTA 12/02/2015, 8:48 AM  [facility] [address] [place], [place], [address] Phone: [phone]   Fax:  [phone]  Name: Allison Campos MRN: [id number] Date of Birth: 10-15-1965

## 2015-12-05 ENCOUNTER — Encounter: Payer: Self-pay | Admitting: Family Medicine

## 2015-12-05 ENCOUNTER — Ambulatory Visit: Payer: BLUE CROSS/BLUE SHIELD | Admitting: Physical Therapy

## 2015-12-05 ENCOUNTER — Encounter: Payer: Self-pay | Admitting: Physical Therapy

## 2015-12-05 DIAGNOSIS — M25611 Stiffness of right shoulder, not elsewhere classified: Secondary | ICD-10-CM

## 2015-12-05 DIAGNOSIS — M542 Cervicalgia: Secondary | ICD-10-CM

## 2015-12-05 DIAGNOSIS — M25511 Pain in right shoulder: Secondary | ICD-10-CM

## 2015-12-05 DIAGNOSIS — M546 Pain in thoracic spine: Secondary | ICD-10-CM | POA: Diagnosis not present

## 2015-12-05 NOTE — Therapy (Signed)
[facility] [address] [place], [place], [address] Phone: [phone]   Fax:  [phone]  Physical Therapy Treatment  Patient Details  Name: Allison Campos MRN: [id number] Date of Birth: [birth year] Referring Provider: Dr. Betty Martinique  Encounter Date: 12/05/2015      PT End of Session - 12/05/15 0923    Visit Number 17   Date for PT Re-Evaluation 12/21/15   PT Start Time 0921   PT Stop Time 1025   PT Time Calculation (min) 64 min   Activity Tolerance Patient tolerated treatment well      Past Medical History  Diagnosis Date  . HYPOTHYROIDISM, POST-RADIATION 07/19/2008  . GOITER, MULTINODULAR 01/08/2008  . 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 Hyperthyroidism   . Grave's disease   . Alcoholism in family     Past Surgical History  Procedure Laterality Date  . Shoulder arthroscopy      left  . Laparoscopy      Endometriosis  . Cesarean section      x2  . Foot surgery      to remove sewing needle    There were no vitals filed for this visit.      Subjective Assessment - 12/05/15 0922    Subjective Had a good weekend. Lightheadness this weekedn with her walk, HA are intermittent. She plans to contact MD again with questions regarding HAs.   Currently in Pain? No/denies  Currently   Multiple Pain Sites No                         [facility] Adult PT Treatment/Exercise - 12/05/15 0001    Shoulder Exercises: Standing   Other Standing Exercises Finger ladder 10x   reaches #20, added 1# to ex   Other Standing Exercises yellow rockwood 10x2 each   Shoulder Exercises: Pulleys   Flexion 3 minutes   ABduction 3 minutes   Shoulder Exercises: ROM/Strengthening   UBE (Upper Arm Bike) L2 3x3  Sitting on green ball   Moist Heat Therapy   Number Minutes Moist Heat 15 Minutes   Moist Heat Location Shoulder   Electrical Stimulation   Electrical Stimulation Location neck and  shoulder   Electrical Stimulation Action IFC   Electrical Stimulation Parameters 15   Electrical Stimulation Goals Pain   Manual Therapy   Soft tissue mobilization cervical, scapula   Myofascial Release cervical   Passive ROM neck                PT Education - 12/05/15 0933    Education provided Yes  Gave pt band for home   Education Details Yellow rockwood for HEP   Person(s) Educated Patient   Methods Explanation;Demonstration;Tactile cues;Verbal cues;Handout   Comprehension Verbalized understanding;Returned demonstration          PT Short Term Goals - 11/16/15 0848    PT SHORT TERM GOAL #5   Title report a 25% reduction in neck and shoulder pain with sitting and ADLs   Time 4   Period Weeks   Status Achieved           PT Long Term Goals - 12/05/15 [id number]    PT LONG TERM GOAL #1   Title be independent in advanced HEP   Time 8   Period Weeks   Status --  Added to that today   PT LONG TERM GOAL #2   Title reduce FOTO to < or =  to 34% limitation   Time 8   Period Weeks   PT LONG TERM GOAL #3   Title demonstrate full cervical AROM without to improve safety with driving and ease with ADLs   Time 8   Period Weeks   Status Achieved   PT LONG TERM GOAL #4   Title get dressed with < or = to 3/10 Rt shoulder pain   Time 8   Period Weeks   Status Achieved  0-1/10   PT LONG TERM GOAL #5   Title report a 60% reduction in neck and shoulder pain with ADLs and work tasks   Time 8   Period Weeks   Status On-going               Plan - 12/05/15 [id number]    Clinical Impression Statement Pt feels like her shouder is doing very well: more ROM and strength. She can now put a sports bra. HA still come and go with the lightheadedness. She plans on speaking to MD about this bc she is frustrated  she cannot progress her cardio the way  she wants.  Cervical ROM WNL.    Rehab Potential Good   Clinical Impairments Affecting Rehab Potential None   PT Duration 8 weeks    PT Treatment/Interventions ADLs/Self Care Home Management;Cryotherapy;Electrical Stimulation;Iontophoresis 4mg /ml Dexamethasone;Moist Heat;Therapeutic exercise;Therapeutic activities;Functional mobility training;Ultrasound;Neuromuscular re-education;Traction;Patient/family education;Manual techniques;Taping;Dry needling;Passive range of motion   PT Next Visit Plan Shoulder strength, manual to cervical, pt would benefit from dry needlingto the cervical spine. Wqill discuss with PT.    Consulted and Agree with Plan of Care Patient      Patient will benefit from skilled therapeutic intervention in order to improve the following deficits and impairments:  Postural dysfunction, Decreased strength, Improper body mechanics, Impaired flexibility, Pain, Decreased activity tolerance, Decreased endurance, Increased muscle spasms, Decreased range of motion  Visit Diagnosis: Pain in right shoulder  Stiffness of right shoulder, not elsewhere classified  Cervicalgia  Pain in thoracic spine     Problem List Patient Active Problem List   Diagnosis Date Noted  . Fatigue 09/02/2015  . Hx of adenomatous colonic polyps 09/29/2014  . Low back pain 09/29/2014  . Former smoker 09/29/2014  . Internal hemorrhoids 10/13/2012  . 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 GOITER, MULTINODULAR 01/08/2008  . ADJUSTMENT DISORDER WITH MIXED FEATURES 10/24/2006    [name], PTA 12/05/2015, 10:11 AM  [facility] [address], [place], [place], [address] Phone: [phone]   Fax:  [phone]  Name: Allison Campos MRN: [id number] Date of Birth: Jul 12, 1965  Educated pt how to use tennis balls for OA release work at home. Pt able to correctly demo.

## 2015-12-05 NOTE — Patient Instructions (Signed)
Strengthening: Resisted Internal Rotation   Hold tubing in left hand, elbow at side and forearm out. Rotate forearm in across body. Repeat ____ times per set. Do ____ sets per session. Do ____ sessions per day.  http://orth.exer.us/830   Copyright  VHI. All rights reserved.  Strengthening: Resisted External Rotation   Hold tubing in right hand, elbow at side and forearm across body. Rotate forearm out. Repeat ____ times per set. Do ____ sets per session. Do ____ sessions per day.  http://orth.exer.us/828   Copyright  VHI. All rights reserved.  Strengthening: Resisted Flexion   Hold tubing with left arm at side. Pull forward and up. Move shoulder through pain-free range of motion. Repeat ____ times per set. Do ____ sets per session. Do ____ sessions per day.  http://orth.exer.us/824   Copyright  VHI. All rights reserved.  Strengthening: Resisted Extension   Hold tubing in right hand, arm forward. Pull arm back, elbow straight. Repeat ____ times per set. Do ____ sets per session. Do ____ sessions per day.  http://orth.exer.us/832   Copyright  VHI. All rights reserved.   

## 2015-12-07 ENCOUNTER — Ambulatory Visit: Payer: BLUE CROSS/BLUE SHIELD

## 2015-12-07 DIAGNOSIS — M542 Cervicalgia: Secondary | ICD-10-CM

## 2015-12-07 DIAGNOSIS — M25511 Pain in right shoulder: Secondary | ICD-10-CM | POA: Diagnosis not present

## 2015-12-07 DIAGNOSIS — M25611 Stiffness of right shoulder, not elsewhere classified: Secondary | ICD-10-CM | POA: Diagnosis not present

## 2015-12-07 DIAGNOSIS — M546 Pain in thoracic spine: Secondary | ICD-10-CM | POA: Diagnosis not present

## 2015-12-07 NOTE — Therapy (Signed)
[facility] [address] [place], [place], [address] Phone: [phone]   Fax:  [phone]  Physical Therapy Treatment  Patient Details  Name: Allison Campos MRN: [id number] Date of Birth: 26-Nov-1965 Referring Provider: Dr. Betty Martinique  Encounter Date: 12/07/2015      PT End of Session - 12/07/15 0929    Visit Number 17   Date for PT Re-Evaluation 12/21/15   PT Start Time 0840   PT Stop Time 0940   PT Time Calculation (min) 60 min   Activity Tolerance Patient tolerated treatment well   Behavior During Therapy [facility] for tasks assessed/performed      Past Medical History  Diagnosis Date  . HYPOTHYROIDISM, POST-RADIATION 07/19/2008  . GOITER, MULTINODULAR 01/08/2008  . 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 Hyperthyroidism   . Grave's disease   . Alcoholism in family     Past Surgical History  Procedure Laterality Date  . Shoulder arthroscopy      left  . Laparoscopy      Endometriosis  . Cesarean section      x2  . Foot surgery      to remove sewing needle    There were no vitals filed for this visit.      Subjective Assessment - 12/07/15 0849    Subjective Pt reports that she is feeling dizzy today.  She is going to see an MD soon to discuss.   Currently in Pain? Yes   Pain Score 4    Pain Location Neck   Pain Orientation Right;Left   Pain Descriptors / Indicators Aching;Dull   Pain Type Chronic pain   Pain Onset More than a month ago   Pain Frequency Constant   Aggravating Factors  driving, being upright   Pain Relieving Factors exercise, rest                         [facility] Adult PT Treatment/Exercise - 12/07/15 0001    Shoulder Exercises: Pulleys   Flexion 3 minutes   ABduction 3 minutes   Moist Heat Therapy   Number Minutes Moist Heat 15 Minutes   Moist Heat Location Cervical   Electrical Stimulation   Electrical Stimulation Location neck and shoulder    Electrical Stimulation Action IFC   Electrical Stimulation Parameters 15   Electrical Stimulation Goals Pain   Manual Therapy   Soft tissue mobilization cervical, scapula   Myofascial Release cervical   Passive ROM neck          Trigger Point Dry Needling - 12/07/15 0853    Consent Given? Yes   Education Handout Provided Yes   Muscles Treated Upper Body Upper trapezius;Oblique capitus;Suboccipitals muscle group  bil. cervical paraspinals   Upper Trapezius Response Twitch reponse elicited;Palpable increased muscle length   Oblique Capitus Response Twitch response elicited;Palpable increased muscle length   SubOccipitals Response Twitch response elicited;Palpable increased muscle length              PT Education - 12/07/15 0855    Education provided Yes   Education Details Dry needling info   Person(s) Educated Patient   Methods Explanation;Handout   Comprehension Verbalized understanding          PT Short Term Goals - 11/16/15 0848    PT SHORT TERM GOAL #5   Title report a 25% reduction in neck and shoulder pain with sitting and ADLs   Time 4   Period Weeks  Status Achieved           PT Long Term Goals - 12/05/15 0925    PT LONG TERM GOAL #1   Title be independent in advanced HEP   Time 8   Period Weeks   Status --  Added to that today   PT LONG TERM GOAL #2   Title reduce FOTO to < or = to 34% limitation   Time 8   Period Weeks   PT LONG TERM GOAL #3   Title demonstrate full cervical AROM without to improve safety with driving and ease with ADLs   Time 8   Period Weeks   Status Achieved   PT LONG TERM GOAL #4   Title get dressed with < or = to 3/10 Rt shoulder pain   Time 8   Period Weeks   Status Achieved  0-1/10   PT LONG TERM GOAL #5   Title report a 60% reduction in neck and shoulder pain with ADLs and work tasks   Time 8   Period Weeks   Status On-going               Plan - 12/07/15 0926    Clinical Impression  Statement Pt was dizzy today so limited exercise today.  Pt with active trigger points in neck bilaterally including UT, paraspinals and suboccipitals.  Pt with improved tissue length and mobilty after dry needling today.  Pt will continue to benefit from skilled PT for dry needling, manual, and advancement of activity as tolerated.     Rehab Potential Good   PT Frequency 3x / week   PT Duration 8 weeks   PT Treatment/Interventions ADLs/Self Care Home Management;Cryotherapy;Electrical Stimulation;Iontophoresis 4mg /ml Dexamethasone;Moist Heat;Therapeutic exercise;Therapeutic activities;Functional mobility training;Ultrasound;Neuromuscular re-education;Traction;Patient/family education;Manual techniques;Taping;Dry needling;Passive range of motion   PT Next Visit Plan Shoulder strength, manual to cervical, advance activity as tolerated. Dry needling next week.     Consulted and Agree with Plan of Care Patient      Patient will benefit from skilled therapeutic intervention in order to improve the following deficits and impairments:  Postural dysfunction, Decreased strength, Improper body mechanics, Impaired flexibility, Pain, Decreased activity tolerance, Decreased endurance, Increased muscle spasms, Decreased range of motion  Visit Diagnosis: Pain in right shoulder  Stiffness of right shoulder, not elsewhere classified  Cervicalgia  Pain in thoracic spine     Problem List Patient Active Problem List   Diagnosis Date Noted  . Fatigue 09/02/2015  . Hx of adenomatous colonic polyps 09/29/2014  . Low back pain 09/29/2014  . Former smoker 09/29/2014  . Internal hemorrhoids 10/13/2012  . 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 GOITER, MULTINODULAR 01/08/2008  . ADJUSTMENT DISORDER WITH MIXED FEATURES 10/24/2006     Sigurd Sos, PT 12/07/2015 9:30 AM  [facility] [address] [place], [place], [address] Phone:  [phone]   Fax:  [phone]  Name: Allison Campos MRN: [id number] Date of Birth: 10-Aug-1965

## 2015-12-07 NOTE — Patient Instructions (Signed)

## 2015-12-08 ENCOUNTER — Ambulatory Visit (INDEPENDENT_AMBULATORY_CARE_PROVIDER_SITE_OTHER): Payer: BLUE CROSS/BLUE SHIELD | Admitting: Family Medicine

## 2015-12-08 ENCOUNTER — Encounter: Payer: Self-pay | Admitting: Family Medicine

## 2015-12-08 VITALS — BP 100/80 | HR 82 | Temp 98.1°F | Ht 64.0 in | Wt 155.6 lb

## 2015-12-08 DIAGNOSIS — S060X9D Concussion with loss of consciousness of unspecified duration, subsequent encounter: Secondary | ICD-10-CM | POA: Diagnosis not present

## 2015-12-08 NOTE — Progress Notes (Signed)
Pre visit review using our clinic review tool, if applicable. No additional management support is needed unless otherwise documented below in the visit note. 

## 2015-12-08 NOTE — Patient Instructions (Signed)
BEFORE YOU LEAVE: -follow up: 1 month with PCP  Listen to your body. Continue to try to SLOWLY ease back into your regular activities - but always back up a little if causes symptoms.  Take you muscle relaxer and analgesics if needed per instructions.  Follow up sooner if needed.
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
## 2015-12-09 ENCOUNTER — Ambulatory Visit: Payer: BLUE CROSS/BLUE SHIELD | Admitting: Physical Therapy

## 2015-12-09 ENCOUNTER — Encounter: Payer: Self-pay | Admitting: Physical Therapy

## 2015-12-09 DIAGNOSIS — M546 Pain in thoracic spine: Secondary | ICD-10-CM

## 2015-12-09 DIAGNOSIS — M25511 Pain in right shoulder: Secondary | ICD-10-CM

## 2015-12-09 DIAGNOSIS — M542 Cervicalgia: Secondary | ICD-10-CM | POA: Diagnosis not present

## 2015-12-09 DIAGNOSIS — M25611 Stiffness of right shoulder, not elsewhere classified: Secondary | ICD-10-CM | POA: Diagnosis not present

## 2015-12-09 NOTE — Therapy (Signed)
[facility] [address] [place], [place], [address] Phone: [phone]   Fax:  [phone]  Physical Therapy Treatment  Patient Details  Name: Lucretia Chatten MRN: [id number] Date of Birth: 17-May-1966 Referring Provider: Dr. Betty Martinique  Encounter Date: 12/09/2015      PT End of Session - 12/09/15 0759    Visit Number 18   Date for PT Re-Evaluation 12/21/15   PT Start Time 0758   PT Stop Time 0900   PT Time Calculation (min) 62 min   Activity Tolerance Patient tolerated treatment well   Behavior During Therapy [facility] for tasks assessed/performed      Past Medical History  Diagnosis Date  . HYPOTHYROIDISM, POST-RADIATION 07/19/2008  . GOITER, MULTINODULAR 01/08/2008  . 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 Hyperthyroidism   . Grave's disease   . Alcoholism in family     Past Surgical History  Procedure Laterality Date  . Shoulder arthroscopy      left  . Laparoscopy      Endometriosis  . Cesarean section      x2  . Foot surgery      to remove sewing needle    There were no vitals filed for this visit.      Subjective Assessment - 12/09/15 0800    Subjective went to MD yesterday. Feels HA are from neck tightness/pain, the lightheadedness from low blood pressure: keep listening to your body. Follow up in 1 month. No lightheadedness  this AM.    Currently in Pain? Yes   Pain Score 3    Pain Location Neck   Pain Orientation Left   Multiple Pain Sites No                         [facility] Adult PT Treatment/Exercise - 12/09/15 0001    Shoulder Exercises: Standing   Other Standing Exercises Finger ladder 10x   reaches #20, added 1# to ex   Other Standing Exercises Red rockwood: 10x each   Shoulder Exercises: Pulleys   Flexion 3 minutes   ABduction 3 minutes   Shoulder Exercises: ROM/Strengthening   UBE (Upper Arm Bike) L1 3x3    Moist Heat Therapy   Number Minutes  Moist Heat 15 Minutes   Moist Heat Location Cervical   Electrical Stimulation   Electrical Stimulation Location neck and shoulder   Electrical Stimulation Action IFC   Electrical Stimulation Parameters 15   Electrical Stimulation Goals Pain   Manual Therapy   Soft tissue mobilization cervical, scapula   Myofascial Release cervical   Passive ROM neck                  PT Short Term Goals - 11/16/15 0848    PT SHORT TERM GOAL #5   Title report a 25% reduction in neck and shoulder pain with sitting and ADLs   Time 4   Period Weeks   Status Achieved           PT Long Term Goals - 12/05/15 [id number]    PT LONG TERM GOAL #1   Title be independent in advanced HEP   Time 8   Period Weeks   Status --  Added to that today   PT LONG TERM GOAL #2   Title reduce FOTO to < or = to 34% limitation   Time 8   Period Weeks   PT LONG TERM GOAL #3   Title  demonstrate full cervical AROM without to improve safety with driving and ease with ADLs   Time 8   Period Weeks   Status Achieved   PT LONG TERM GOAL #4   Title get dressed with < or = to 3/10 Rt shoulder pain   Time 8   Period Weeks   Status Achieved  0-1/10   PT LONG TERM GOAL #5   Title report a 60% reduction in neck and shoulder pain with ADLs and work tasks   Time 8   Period Weeks   Status On-going               Plan - 12/09/15 0800    Clinical Impression Statement MD encouraged pt to continue to listen to her body and remains without restriction. No soreness after dry needling and in fact, upper traps felt less restricted today.    Rehab Potential Good   Clinical Impairments Affecting Rehab Potential None   PT Frequency 3x / week   PT Duration 8 weeks   PT Treatment/Interventions ADLs/Self Care Home Management;Cryotherapy;Electrical Stimulation;Iontophoresis 4mg /ml Dexamethasone;Moist Heat;Therapeutic exercise;Therapeutic activities;Functional mobility training;Ultrasound;Neuromuscular  re-education;Traction;Patient/family education;Manual techniques;Taping;Dry needling;Passive range of motion   PT Next Visit Plan Dry needling session next.    Consulted and Agree with Plan of Care Patient      Patient will benefit from skilled therapeutic intervention in order to improve the following deficits and impairments:  Postural dysfunction, Decreased strength, Improper body mechanics, Impaired flexibility, Pain, Decreased activity tolerance, Decreased endurance, Increased muscle spasms, Decreased range of motion  Visit Diagnosis: Pain in right shoulder  Stiffness of right shoulder, not elsewhere classified  Cervicalgia  Pain in thoracic spine     Problem List Patient Active Problem List   Diagnosis Date Noted  . Fatigue 09/02/2015  . Hx of adenomatous colonic polyps 09/29/2014  . Low back pain 09/29/2014  . Former smoker 09/29/2014  . Internal hemorrhoids 10/13/2012  . 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 GOITER, MULTINODULAR 01/08/2008  . ADJUSTMENT DISORDER WITH MIXED FEATURES 10/24/2006    Rushawn Capshaw, PTA 12/09/2015, 8:46 AM  [facility] [address], [place], [place], [address] Phone: [phone]   Fax:  [phone]  Name: Kimela Adcox MRN: [id number] Date of Birth: 1966/05/01

## 2015-12-12 ENCOUNTER — Ambulatory Visit: Payer: BLUE CROSS/BLUE SHIELD

## 2015-12-12 DIAGNOSIS — M546 Pain in thoracic spine: Secondary | ICD-10-CM

## 2015-12-12 DIAGNOSIS — M25511 Pain in right shoulder: Secondary | ICD-10-CM | POA: Diagnosis not present

## 2015-12-12 DIAGNOSIS — M25611 Stiffness of right shoulder, not elsewhere classified: Secondary | ICD-10-CM

## 2015-12-12 DIAGNOSIS — M542 Cervicalgia: Secondary | ICD-10-CM

## 2015-12-12 NOTE — Therapy (Signed)
[facility] [address], [place], [place], [address] Phone: [phone]   Fax:  [phone]  Physical Therapy Treatment  Patient Details  Name: Allison Campos MRN: [id number] Date of Birth: 1965/12/24 Referring Provider: Dr. Betty Martinique  Encounter Date: 12/12/2015      PT End of Session - 12/12/15 0843    Visit Number 19   Date for PT Re-Evaluation 12/21/15   PT Start Time 0759   PT Stop Time 0904   PT Time Calculation (min) 65 min   Activity Tolerance Patient tolerated treatment well   Behavior During Therapy [facility] for tasks assessed/performed      Past Medical History  Diagnosis Date  . HYPOTHYROIDISM, POST-RADIATION 07/19/2008  . GOITER, MULTINODULAR 01/08/2008  . 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 Hyperthyroidism   . Grave's disease   . Alcoholism in family     Past Surgical History  Procedure Laterality Date  . Shoulder arthroscopy      left  . Laparoscopy      Endometriosis  . Cesarean section      x2  . Foot surgery      to remove sewing needle    There were no vitals filed for this visit.      Subjective Assessment - 12/12/15 0800    Subjective My neck and UT feel looser.  Had a massage yesterday and it helped.  I increased my activity over the weekend and it didn't increase my headaches.     Diagnostic tests CT and x-ray: all clear   Patient Stated Goals reduce pain   Currently in Pain? Yes   Pain Score 1    Pain Location Neck   Pain Orientation Left   Pain Descriptors / Indicators Aching;Dull   Pain Onset More than a month ago   Pain Frequency Constant   Aggravating Factors  driving, being upright   Pain Relieving Factors exercise, rest                         [facility] Adult PT Treatment/Exercise - 12/12/15 0001    Shoulder Exercises: Standing   Other Standing Exercises Finger ladder 10x   reaches #20, added 1# to ex   Shoulder Exercises: Pulleys    Flexion 3 minutes   ABduction 3 minutes   Shoulder Exercises: ROM/Strengthening   UBE (Upper Arm Bike) L1 3x3    Moist Heat Therapy   Number Minutes Moist Heat 15 Minutes   Moist Heat Location Cervical   Electrical Stimulation   Electrical Stimulation Location neck and shoulder   Electrical Stimulation Action IFC   Electrical Stimulation Parameters 15 mintues   Electrical Stimulation Goals Pain   Manual Therapy   Soft tissue mobilization cervical, scapula   Myofascial Release cervical   Passive ROM neck          Trigger Point Dry Needling - 12/12/15 0809    Consent Given? Yes   Muscles Treated Upper Body Suboccipitals muscle group;Oblique capitus;Upper trapezius  cervical paraspinals   Upper Trapezius Response Twitch reponse elicited;Palpable increased muscle length   Oblique Capitus Response Twitch response elicited;Palpable increased muscle length   SubOccipitals Response Twitch response elicited;Palpable increased muscle length                PT Short Term Goals - 11/16/15 0848    PT SHORT TERM GOAL #5   Title report a 25% reduction in neck and shoulder pain with  sitting and ADLs   Time 4   Period Weeks   Status Achieved           PT Long Term Goals - 12/12/15 0802    PT LONG TERM GOAL #1   Title be independent in advanced HEP   Time 8   Period Weeks   Status On-going   PT LONG TERM GOAL #2   Title reduce FOTO to < or = to 34% limitation   Time 8   Period Weeks   Status On-going   PT LONG TERM GOAL #3   Title demonstrate full cervical AROM without to improve safety with driving and ease with ADLs   Status Achieved   PT LONG TERM GOAL #4   Title get dressed with < or = to 3/10 Rt shoulder pain   Status Achieved   PT LONG TERM GOAL #5   Title report a 60% reduction in neck and shoulder pain with ADLs and work tasks   Time 8   Period Weeks   Status On-going   PT LONG TERM GOAL #6   Title return to regular exercise routine without limitation    Time 8   Period Weeks   Status On-going               Plan - 12/12/15 0803    Clinical Impression Statement Pt responded well to dry needling with improved mobiltiy of neck and upper trap tissue.  Pt is still modifying her exercise routine.  Pt with continued headaches and limited ability to perform activity due to dizziness  Pt is still working shorter work days..  Pt was able to increase her activity without increased headaches this weekend.  Pt will try to do aerobics this week to determine if she is able to without pain.  Pt will continue to benefit from skilled PT for strength, manual/modalities, and flexiblity.     Rehab Potential Good   PT Frequency 3x / week   PT Duration 8 weeks   PT Treatment/Interventions ADLs/Self Care Home Management;Cryotherapy;Electrical Stimulation;Iontophoresis 4mg /ml Dexamethasone;Moist Heat;Therapeutic exercise;Therapeutic activities;Functional mobility training;Ultrasound;Neuromuscular re-education;Traction;Patient/family education;Manual techniques;Taping;Dry needling;Passive range of motion   PT Next Visit Plan Assess response to Dry needling, manual, modalities, strength and flexiblity progression.     Consulted and Agree with Plan of Care Patient      Patient will benefit from skilled therapeutic intervention in order to improve the following deficits and impairments:  Postural dysfunction, Decreased strength, Improper body mechanics, Impaired flexibility, Pain, Decreased activity tolerance, Decreased endurance, Increased muscle spasms, Decreased range of motion  Visit Diagnosis: Stiffness of right shoulder, not elsewhere classified  Pain in right shoulder  Cervicalgia  Pain in thoracic spine     Problem List Patient Active Problem List   Diagnosis Date Noted  . Fatigue 09/02/2015  . Hx of adenomatous colonic polyps 09/29/2014  . Low back pain 09/29/2014  . Former smoker 09/29/2014  . Internal hemorrhoids 10/13/2012  .  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 GOITER, MULTINODULAR 01/08/2008  . ADJUSTMENT DISORDER WITH MIXED FEATURES 10/24/2006     Sigurd Sos, PT 12/12/2015 8:47 AM  [facility] [address], [place], [place], [address] Phone: [phone]   Fax:  [phone]  Name: Allison Campos MRN: [id number] Date of Birth: 04-Jun-1965

## 2015-12-14 ENCOUNTER — Encounter: Payer: Self-pay | Admitting: Physical Therapy

## 2015-12-14 ENCOUNTER — Ambulatory Visit: Payer: BLUE CROSS/BLUE SHIELD | Admitting: Physical Therapy

## 2015-12-14 DIAGNOSIS — M25511 Pain in right shoulder: Secondary | ICD-10-CM | POA: Diagnosis not present

## 2015-12-14 DIAGNOSIS — M25611 Stiffness of right shoulder, not elsewhere classified: Secondary | ICD-10-CM | POA: Diagnosis not present

## 2015-12-14 DIAGNOSIS — M542 Cervicalgia: Secondary | ICD-10-CM | POA: Diagnosis not present

## 2015-12-14 DIAGNOSIS — M546 Pain in thoracic spine: Secondary | ICD-10-CM

## 2015-12-14 NOTE — Patient Instructions (Signed)
  PNF Strengthening: Resisted   Standing with resistive band around each hand, bring right arm up and away, thumb back. Repeat _10___ times per set. Do _2___ sets per session. Do _1-2___ sessions per day.      Resisted Horizontal Abduction: Bilateral   Sit or stand, tubing in both hands, arms out in front. Keeping arms straight, pinch shoulder blades together and stretch arms out. Repeat _10___ times per set. Do 2____ sets per session. Do _1-2___ sessions per day.                  Scapular Retraction: Elbow Flexion (Standing)   With elbows bent to 90, pinch shoulder blades together and rotate arms out, keeping elbows bent. Repeat _10___ times per set. Do _1___ sets per session. Do many____ sessions per day.    Strengthening: Resisted Extension   Hold tubing in right hand, arm forward. Pull arm back, elbow straight. Repeat _10___ times per set. Do _2___ sets per session. Do _1-2___ sessions per day.  Can place band around the front of your body and perform with both arms at the same time.   

## 2015-12-14 NOTE — Therapy (Signed)
[facility] [address] [place], [place], [address] Phone: [phone]   Fax:  [phone]  Physical Therapy Treatment  Patient Details  Name: Ambriana Scher MRN: [id number] Date of Birth: 08-02-1965 Referring Provider: Dr. Betty Martinique  Encounter Date: 12/14/2015      PT End of Session - 12/14/15 0846    Visit Number 20   Date for PT Re-Evaluation 12/21/15   PT Start Time 0845   PT Stop Time 0945   PT Time Calculation (min) 60 min   Activity Tolerance Patient tolerated treatment well   Behavior During Therapy [facility] for tasks assessed/performed      Past Medical History  Diagnosis Date  . HYPOTHYROIDISM, POST-RADIATION 07/19/2008  . GOITER, MULTINODULAR 01/08/2008  . 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 Hyperthyroidism   . Grave's disease   . Alcoholism in family     Past Surgical History  Procedure Laterality Date  . Shoulder arthroscopy      left  . Laparoscopy      Endometriosis  . Cesarean section      x2  . Foot surgery      to remove sewing needle    There were no vitals filed for this visit.      Subjective Assessment - 12/14/15 0847    Subjective Dry needling was ok, no soreness. Pt reports she is doing some kind of exercise every day. Still has pain in various places, fatigue, but she can self monitor.    Currently in Pain? No/denies   Multiple Pain Sites No                         [facility] Adult PT Treatment/Exercise - 12/14/15 0001    Shoulder Exercises: Standing   Other Standing Exercises Finger ladder 1 1/2 # 10x    Other Standing Exercises Red rockwood 15x each   Shoulder Exercises: Pulleys   Flexion 3 minutes   ABduction 3 minutes   Shoulder Exercises: ROM/Strengthening   UBE (Upper Arm Bike) L1 3x3    Moist Heat Therapy   Number Minutes Moist Heat 15 Minutes   Moist Heat Location --  Scapula   Electrical Stimulation   Electrical Stimulation  Location Scapula   Electrical Stimulation Action IFC   Electrical Stimulation Parameters 15 min   Electrical Stimulation Goals Pain   Manual Therapy   Soft tissue mobilization cervical, scapula   Myofascial Release cervical                PT Education - 12/14/15 0907    Education provided Yes   Education Details Red band scapular unattached  for HEP   Person(s) Educated Patient   Methods Explanation;Demonstration;Tactile cues;Verbal cues;Handout   Comprehension Verbalized understanding;Returned demonstration          PT Short Term Goals - 11/16/15 0848    PT SHORT TERM GOAL #5   Title report a 25% reduction in neck and shoulder pain with sitting and ADLs   Time 4   Period Weeks   Status Achieved           PT Long Term Goals - 12/12/15 0802    PT LONG TERM GOAL #1   Title be independent in advanced HEP   Time 8   Period Weeks   Status On-going   PT LONG TERM GOAL #2   Title reduce FOTO to < or = to 34% limitation   Time  8   Period Weeks   Status On-going   PT LONG TERM GOAL #3   Title demonstrate full cervical AROM without to improve safety with driving and ease with ADLs   Status Achieved   PT LONG TERM GOAL #4   Title get dressed with < or = to 3/10 Rt shoulder pain   Status Achieved   PT LONG TERM GOAL #5   Title report a 60% reduction in neck and shoulder pain with ADLs and work tasks   Time 8   Period Weeks   Status On-going   PT LONG TERM GOAL #6   Title return to regular exercise routine without limitation   Time 8   Period Weeks   Status On-going               Plan - 12/14/15 0847    Clinical Impression Statement Pt is exercising a little every day, using her self monitoring for symptoms. Added new HEP today for scapular stabilization.    Rehab Potential Good   Clinical Impairments Affecting Rehab Potential None   PT Frequency 3x / week   PT Duration 8 weeks   PT Treatment/Interventions ADLs/Self Care Home  Management;Cryotherapy;Electrical Stimulation;Iontophoresis 4mg /ml Dexamethasone;Moist Heat;Therapeutic exercise;Therapeutic activities;Functional mobility training;Ultrasound;Neuromuscular re-education;Traction;Patient/family education;Manual techniques;Taping;Dry needling;Passive range of motion   PT Next Visit Plan Manual, RT shoulder/scapular strength, modalities   Consulted and Agree with Plan of Care Patient      Patient will benefit from skilled therapeutic intervention in order to improve the following deficits and impairments:  Postural dysfunction, Decreased strength, Improper body mechanics, Impaired flexibility, Pain, Decreased activity tolerance, Decreased endurance, Increased muscle spasms, Decreased range of motion  Visit Diagnosis: Stiffness of right shoulder, not elsewhere classified  Pain in right shoulder  Cervicalgia  Pain in thoracic spine     Problem List Patient Active Problem List   Diagnosis Date Noted  . Fatigue 09/02/2015  . Hx of adenomatous colonic polyps 09/29/2014  . Low back pain 09/29/2014  . Former smoker 09/29/2014  . Internal hemorrhoids 10/13/2012  . 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 GOITER, MULTINODULAR 01/08/2008  . ADJUSTMENT DISORDER WITH MIXED FEATURES 10/24/2006    Ladon Vandenberghe, PTA 12/14/2015, 9:29 AM  [facility] [address], [place], [place], [address] Phone: [phone]   Fax:  [phone]  Name: Roni Bakowski MRN: [id number] Date of Birth: 14-Dec-1965

## 2015-12-16 ENCOUNTER — Ambulatory Visit: Payer: BLUE CROSS/BLUE SHIELD | Admitting: Physical Therapy

## 2015-12-16 ENCOUNTER — Encounter: Payer: Self-pay | Admitting: Physical Therapy

## 2015-12-16 DIAGNOSIS — M25611 Stiffness of right shoulder, not elsewhere classified: Secondary | ICD-10-CM | POA: Diagnosis not present

## 2015-12-16 DIAGNOSIS — M546 Pain in thoracic spine: Secondary | ICD-10-CM

## 2015-12-16 DIAGNOSIS — M542 Cervicalgia: Secondary | ICD-10-CM

## 2015-12-16 DIAGNOSIS — M25511 Pain in right shoulder: Secondary | ICD-10-CM | POA: Diagnosis not present

## 2015-12-16 NOTE — Therapy (Signed)
[facility] [address] [place], [place], [address] Phone: [phone]   Fax:  [phone]  Physical Therapy Treatment  Patient Details  Name: Allison Campos MRN: [id number] Date of Birth: 07/07/1965 Referring Provider: Dr. Betty Martinique  Encounter Date: 12/16/2015      PT End of Session - 12/16/15 0801    Visit Number 21   Date for PT Re-Evaluation 12/21/15   PT Start Time 0756   PT Stop Time 0900   PT Time Calculation (min) 64 min   Activity Tolerance Patient tolerated treatment well   Behavior During Therapy [facility] for tasks assessed/performed      Past Medical History  Diagnosis Date  . HYPOTHYROIDISM, POST-RADIATION 07/19/2008  . GOITER, MULTINODULAR 01/08/2008  . 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 Hyperthyroidism   . Grave's disease   . Alcoholism in family     Past Surgical History  Procedure Laterality Date  . Shoulder arthroscopy      left  . Laparoscopy      Endometriosis  . Cesarean section      x2  . Foot surgery      to remove sewing needle    There were no vitals filed for this visit.      Subjective Assessment - 12/16/15 0759    Subjective I think I am turning the corner.    Currently in Pain? Yes   Pain Score 3    Pain Location Hand   Pain Orientation Right   Multiple Pain Sites No                         [facility] Adult PT Treatment/Exercise - 12/16/15 0001    Shoulder Exercises: Supine   Horizontal ABduction Strengthening;Both;10 reps;Theraband   Theraband Level (Shoulder Horizontal ABduction) Level 2 (Red)   Shoulder Exercises: Seated   Other Seated Exercises 3 way raise 1# 12x   Shoulder Exercises: Standing   Other Standing Exercises Finger ladder 1 1/2 # 10x    Other Standing Exercises Red rockwood 15x each   Shoulder Exercises: Pulleys   Flexion 3 minutes   ABduction 3 minutes   Shoulder Exercises: ROM/Strengthening   UBE (Upper Arm  Bike) L1 3x3    Moist Heat Therapy   Number Minutes Moist Heat 15 Minutes   Moist Heat Location --  Scapula   Electrical Stimulation   Electrical Stimulation Location Scapula & cervical   Electrical Stimulation Action IFC   Electrical Stimulation Parameters 15   Electrical Stimulation Goals Pain   Manual Therapy   Soft tissue mobilization cervical, scapula   Myofascial Release cervical                  PT Short Term Goals - 11/16/15 0848    PT SHORT TERM GOAL #5   Title report a 25% reduction in neck and shoulder pain with sitting and ADLs   Time 4   Period Weeks   Status Achieved           PT Long Term Goals - 12/12/15 0802    PT LONG TERM GOAL #1   Title be independent in advanced HEP   Time 8   Period Weeks   Status On-going   PT LONG TERM GOAL #2   Title reduce FOTO to < or = to 34% limitation   Time 8   Period Weeks   Status On-going   PT LONG TERM GOAL #  3   Title demonstrate full cervical AROM without to improve safety with driving and ease with ADLs   Status Achieved   PT LONG TERM GOAL #4   Title get dressed with < or = to 3/10 Rt shoulder pain   Status Achieved   PT LONG TERM GOAL #5   Title report a 60% reduction in neck and shoulder pain with ADLs and work tasks   Time 8   Period Weeks   Status On-going   PT LONG TERM GOAL #6   Title return to regular exercise routine without limitation   Time 8   Period Weeks   Status On-going               Plan - 12/16/15 0822    Clinical Impression Statement Pt has had a great week, has not experienced a headache in a few days and feels she is able to exercise longer with less symptoms. She is complinat with her new HEP  and soft tissues at cervical area are feeling good: much softer and supple.    Rehab Potential Good   Clinical Impairments Affecting Rehab Potential None   PT Frequency 3x / week   PT Duration 8 weeks   PT Treatment/Interventions ADLs/Self Care Home  Management;Cryotherapy;Electrical Stimulation;Iontophoresis 4mg /ml Dexamethasone;Moist Heat;Therapeutic exercise;Therapeutic activities;Functional mobility training;Ultrasound;Neuromuscular re-education;Traction;Patient/family education;Manual techniques;Taping;Dry needling;Passive range of motion   PT Next Visit Plan Manual, RT shoulder/scapular strength, modalities,ERO scheduled for Wednesday.   Consulted and Agree with Plan of Care Patient      Patient will benefit from skilled therapeutic intervention in order to improve the following deficits and impairments:  Postural dysfunction, Decreased strength, Improper body mechanics, Impaired flexibility, Pain, Decreased activity tolerance, Decreased endurance, Increased muscle spasms, Decreased range of motion  Visit Diagnosis: Stiffness of right shoulder, not elsewhere classified  Pain in right shoulder  Cervicalgia  Pain in thoracic spine     Problem List Patient Active Problem List   Diagnosis Date Noted  . Fatigue 09/02/2015  . Hx of adenomatous colonic polyps 09/29/2014  . Low back pain 09/29/2014  . Former smoker 09/29/2014  . Internal hemorrhoids 10/13/2012  . 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 GOITER, MULTINODULAR 01/08/2008  . ADJUSTMENT DISORDER WITH MIXED FEATURES 10/24/2006    COCHRAN,JENNIFER,PTA 12/16/2015, 8:48 AM  [facility] [address], [place], [place], [address] Phone: [phone]   Fax:  [phone]  Name: Allison Campos MRN: [id number] Date of Birth: October 29, 1965

## 2015-12-19 ENCOUNTER — Ambulatory Visit: Payer: BLUE CROSS/BLUE SHIELD

## 2015-12-19 DIAGNOSIS — M25511 Pain in right shoulder: Secondary | ICD-10-CM | POA: Diagnosis not present

## 2015-12-19 DIAGNOSIS — M25611 Stiffness of right shoulder, not elsewhere classified: Secondary | ICD-10-CM

## 2015-12-19 DIAGNOSIS — M542 Cervicalgia: Secondary | ICD-10-CM

## 2015-12-19 DIAGNOSIS — M546 Pain in thoracic spine: Secondary | ICD-10-CM | POA: Diagnosis not present
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
## 2015-12-21 ENCOUNTER — Ambulatory Visit: Payer: BLUE CROSS/BLUE SHIELD

## 2015-12-21 DIAGNOSIS — M25511 Pain in right shoulder: Secondary | ICD-10-CM

## 2015-12-21 DIAGNOSIS — M25611 Stiffness of right shoulder, not elsewhere classified: Secondary | ICD-10-CM | POA: Diagnosis not present

## 2015-12-21 DIAGNOSIS — M542 Cervicalgia: Secondary | ICD-10-CM | POA: Diagnosis not present

## 2015-12-21 DIAGNOSIS — M546 Pain in thoracic spine: Secondary | ICD-10-CM | POA: Diagnosis not present
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
## 2016-01-02 ENCOUNTER — Encounter: Payer: Self-pay | Admitting: Physical Therapy

## 2016-01-02 ENCOUNTER — Ambulatory Visit: Payer: BLUE CROSS/BLUE SHIELD | Attending: Family Medicine | Admitting: Physical Therapy

## 2016-01-02 DIAGNOSIS — M542 Cervicalgia: Secondary | ICD-10-CM

## 2016-01-02 DIAGNOSIS — M25511 Pain in right shoulder: Secondary | ICD-10-CM | POA: Insufficient documentation

## 2016-01-02 DIAGNOSIS — M546 Pain in thoracic spine: Secondary | ICD-10-CM | POA: Diagnosis not present

## 2016-01-02 DIAGNOSIS — M25611 Stiffness of right shoulder, not elsewhere classified: Secondary | ICD-10-CM | POA: Diagnosis not present

## 2016-01-02 NOTE — Therapy (Signed)
[facility] [address] [place], [place], [address] Phone: [phone]   Fax:  [phone]  Physical Therapy Treatment  Patient Details  Name: Allison Campos MRN: [id number] Date of Birth: 1966/02/14 Referring Provider: Dr. Betty Martinique  Encounter Date: 01/02/2016      PT End of Session - 01/02/16 1402    Visit Number 24   Date for PT Re-Evaluation 02/03/16   PT Start Time [id number]   PT Stop Time 1450   PT Time Calculation (min) 54 min   Activity Tolerance Patient tolerated treatment well   Behavior During Therapy [facility] for tasks assessed/performed      Past Medical History:  Diagnosis Date  . 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 Alcoholism in family   . GOITER, MULTINODULAR 01/08/2008  . Grave's disease   . Hyperthyroidism   . HYPOTHYROIDISM, POST-RADIATION 07/19/2008    Past Surgical History:  Procedure Laterality Date  . CESAREAN SECTION     x2  . FOOT SURGERY     to remove sewing needle  . LAPAROSCOPY     Endometriosis  . SHOULDER ARTHROSCOPY     left    There were no vitals filed for this visit.      Subjective Assessment - 01/02/16 1400    Subjective Been having more HA lately. Tried to walk faster but this brought on HA, and then had to leave Spin class early due to HA. Getting professional massage 1x month. Had massage yesterday and is pretty sore still.    Currently in Pain? Yes   Pain Score 4    Pain Location Neck   Pain Orientation Right   Pain Descriptors / Indicators Sore   Aggravating Factors  Increasing HR may bring on HA??   Pain Relieving Factors rest   Multiple Pain Sites No                         [facility] Adult PT Treatment/Exercise - 01/02/16 0001      Neck Exercises: Supine   Other Supine Exercise Decompression on foam roll x 2 min post prone work     Shoulder Exercises: Seated   Other Seated Exercises 3 way raise 1# 12x     Shoulder  Exercises: Prone   Other Prone Exercises I, Y, T 2x10 bil.   1# added, only 1 set for I & Y     Shoulder Exercises: ROM/Strengthening   UBE (Upper Arm Bike) L1 3x3      Moist Heat Therapy   Number Minutes Moist Heat 15 Minutes   Moist Heat Location Cervical     Electrical Stimulation   Electrical Stimulation Location Cervical    Electrical Stimulation Action IFC   Electrical Stimulation Parameters 15 min   Electrical Stimulation Goals Pain                  PT Short Term Goals - 11/16/15 0848      PT SHORT TERM GOAL #5   Title report a 25% reduction in neck and shoulder pain with sitting and ADLs   Time 4   Period Weeks   Status Achieved           PT Long Term Goals - 01/02/16 1404      PT LONG TERM GOAL #6   Title return to regular exercise routine without limitation   Time 6   Period Weeks   Status On-going  Plan - 01/02/16 1403    Clinical Impression Statement Pt had an hour massage on her neck yesterday and is fairly sore today. She reports some more HAs lately as she has tried to increase her intensity of her exercise. She plans to ramp her intensity back down. Added 1 # to her prone exercises which was very challenging strength wise for her.    Rehab Potential Good   Clinical Impairments Affecting Rehab Potential None   PT Frequency 2x / week   PT Duration 6 weeks   PT Treatment/Interventions ADLs/Self Care Home Management;Cryotherapy;Electrical Stimulation;Iontophoresis 4mg /ml Dexamethasone;Moist Heat;Therapeutic exercise;Therapeutic activities;Functional mobility training;Ultrasound;Neuromuscular re-education;Traction;Patient/family education;Manual techniques;Taping;Dry needling;Passive range of motion   PT Next Visit Plan Manual to neck and Rt shoulder, strength and endurance progression, modalities   Consulted and Agree with Plan of Care --      Patient will benefit from skilled therapeutic intervention in order to improve  the following deficits and impairments:  Postural dysfunction, Decreased strength, Improper body mechanics, Impaired flexibility, Pain, Decreased activity tolerance, Decreased endurance, Increased muscle spasms, Decreased range of motion  Visit Diagnosis: Stiffness of right shoulder, not elsewhere classified  Pain in right shoulder  Cervicalgia  Pain in thoracic spine     Problem List Patient Active Problem List   Diagnosis Date Noted  . Fatigue 09/02/2015  . Hx of adenomatous colonic polyps 09/29/2014  . Low back pain 09/29/2014  . Former smoker 09/29/2014  . Internal hemorrhoids 10/13/2012  . 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 GOITER, MULTINODULAR 01/08/2008  . ADJUSTMENT DISORDER WITH MIXED FEATURES 10/24/2006    Allison Campos, PTA 01/02/2016, 2:32 PM  [facility] [address] [place], [place], [address] Phone: [phone]   Fax:  [phone]  Name: Allison Campos MRN: [id number] Date of Birth: 10/27/1965

## 2016-01-06 ENCOUNTER — Ambulatory Visit: Payer: BLUE CROSS/BLUE SHIELD | Admitting: Physical Therapy

## 2016-01-06 ENCOUNTER — Encounter: Payer: Self-pay | Admitting: Physical Therapy

## 2016-01-06 DIAGNOSIS — M542 Cervicalgia: Secondary | ICD-10-CM

## 2016-01-06 DIAGNOSIS — M25611 Stiffness of right shoulder, not elsewhere classified: Secondary | ICD-10-CM

## 2016-01-06 DIAGNOSIS — M25511 Pain in right shoulder: Secondary | ICD-10-CM | POA: Diagnosis not present

## 2016-01-06 DIAGNOSIS — M546 Pain in thoracic spine: Secondary | ICD-10-CM | POA: Diagnosis not present

## 2016-01-06 NOTE — Therapy (Signed)
[facility] [address], [place], [place], [address] Phone: [phone]   Fax:  [phone]  Physical Therapy Treatment  Patient Details  Name: Allison Campos MRN: [id number] Date of Birth: 11-26-1965 Referring Provider: Dr. Betty Martinique  Encounter Date: 01/06/2016      PT End of Session - 01/06/16 0759    Visit Number 25   Date for PT Re-Evaluation 02/03/16   PT Start Time 0755   PT Stop Time 0856   PT Time Calculation (min) 61 min   Activity Tolerance Patient tolerated treatment well   Behavior During Therapy [facility] for tasks assessed/performed      Past Medical History:  Diagnosis Date  . 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 Alcoholism in family   . GOITER, MULTINODULAR 01/08/2008  . Grave's disease   . Hyperthyroidism   . HYPOTHYROIDISM, POST-RADIATION 07/19/2008    Past Surgical History:  Procedure Laterality Date  . CESAREAN SECTION     x2  . FOOT SURGERY     to remove sewing needle  . LAPAROSCOPY     Endometriosis  . SHOULDER ARTHROSCOPY     left    There were no vitals filed for this visit.      Subjective Assessment - 01/06/16 0757    Subjective Has done better moderating her activity. She has exercised some and still has HA but not as bad as earlier in the week. Pt can now make a full fist wiht her RT hand now.    Currently in Pain? Yes   Pain Score 3    Pain Location --  Base of neck    Pain Orientation Right   Pain Descriptors / Indicators Dull   Multiple Pain Sites No                         [facility] Adult PT Treatment/Exercise - 01/06/16 0001      Neck Exercises: Supine   Other Supine Exercise Decompression on foam roll x  2 min then red band horoizontal abd 2x10, diagonals bil 2x10   Other Supine Exercise Cervical release with foam roll  MELT Method protocol     Shoulder Exercises: Prone   Other Prone Exercises I, Y, T 2x10 bil.   1# added, only  1 set for I & Y     Shoulder Exercises: ROM/Strengthening   UBE (Upper Arm Bike) L1 3x3      Moist Heat Therapy   Number Minutes Moist Heat 15 Minutes   Moist Heat Location Cervical     Electrical Stimulation   Electrical Stimulation Location Cervical    Electrical Stimulation Action IFC   Electrical Stimulation Parameters 15 min   Electrical Stimulation Goals Pain     Manual Therapy   Soft tissue mobilization cervical, suboccipitals, UT                  PT Short Term Goals - 11/16/15 0848      PT SHORT TERM GOAL #5   Title report a 25% reduction in neck and shoulder pain with sitting and ADLs   Time 4   Period Weeks   Status Achieved           PT Long Term Goals - 01/02/16 1404      PT LONG TERM GOAL #6   Title return to regular exercise routine without limitation   Time 6   Period Weeks   Status  On-going               Plan - 01/06/16 0759    Clinical Impression Statement Pt reports shoulder is 80% improved since eval, neck is 70% improved. Her work day is a little longer before needing to lay down, still has trouble concentrating after 1:00 in pm.     Rehab Potential Good   Clinical Impairments Affecting Rehab Potential None   PT Frequency 2x / week   PT Duration 6 weeks   PT Treatment/Interventions ADLs/Self Care Home Management;Cryotherapy;Electrical Stimulation;Iontophoresis 4mg /ml Dexamethasone;Moist Heat;Therapeutic exercise;Therapeutic activities;Functional mobility training;Ultrasound;Neuromuscular re-education;Traction;Patient/family education;Manual techniques;Taping;Dry needling;Passive range of motion   PT Next Visit Plan Manual to neck and Rt shoulder, strength and endurance progression, modalities   Consulted and Agree with Plan of Care Patient      Patient will benefit from skilled therapeutic intervention in order to improve the following deficits and impairments:  Postural dysfunction, Decreased strength, Improper body  mechanics, Impaired flexibility, Pain, Decreased activity tolerance, Decreased endurance, Increased muscle spasms, Decreased range of motion  Visit Diagnosis: Stiffness of right shoulder, not elsewhere classified  Pain in right shoulder  Cervicalgia     Problem List Patient Active Problem List   Diagnosis Date Noted  . Fatigue 09/02/2015  . Hx of adenomatous colonic polyps 09/29/2014  . Low back pain 09/29/2014  . Former smoker 09/29/2014  . Internal hemorrhoids 10/13/2012  . 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 GOITER, MULTINODULAR 01/08/2008  . ADJUSTMENT DISORDER WITH MIXED FEATURES 10/24/2006    Katlen Seyer, PTA 01/06/2016, 8:42 AM  [facility] [address] [place], [place], [address] Phone: [phone]   Fax:  [phone]  Name: Allison Campos MRN: [id number] Date of Birth: [birth year]

## 2016-01-09 ENCOUNTER — Encounter: Payer: Self-pay | Admitting: Family Medicine

## 2016-01-09 ENCOUNTER — Ambulatory Visit (INDEPENDENT_AMBULATORY_CARE_PROVIDER_SITE_OTHER): Payer: BLUE CROSS/BLUE SHIELD | Admitting: Family Medicine

## 2016-01-09 VITALS — BP 128/84 | HR 57 | Temp 98.7°F | Wt 156.2 lb

## 2016-01-09 DIAGNOSIS — F0781 Postconcussional syndrome: Secondary | ICD-10-CM

## 2016-01-09 DIAGNOSIS — R41840 Attention and concentration deficit: Secondary | ICD-10-CM | POA: Diagnosis not present

## 2016-01-09 DIAGNOSIS — R519 Headache, unspecified: Secondary | ICD-10-CM

## 2016-01-09 DIAGNOSIS — R51 Headache: Secondary | ICD-10-CM

## 2016-01-09 NOTE — Progress Notes (Signed)
Pre visit review using our clinic review tool, if applicable. No additional management support is needed unless otherwise documented below in the visit note. 
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
## 2016-01-09 NOTE — Patient Instructions (Signed)
Let's do more gradual increase in exercise in 2 minute intervals max passed the point that bothered you week prior  We will call you within a week about your referral to neurology. If you do not hear within 2 weeks, give [place] a call.

## 2016-01-11 ENCOUNTER — Ambulatory Visit: Payer: BLUE CROSS/BLUE SHIELD | Admitting: Physical Therapy

## 2016-01-11 ENCOUNTER — Encounter: Payer: Self-pay | Admitting: Physical Therapy

## 2016-01-11 DIAGNOSIS — M25511 Pain in right shoulder: Secondary | ICD-10-CM

## 2016-01-11 DIAGNOSIS — M546 Pain in thoracic spine: Secondary | ICD-10-CM | POA: Diagnosis not present

## 2016-01-11 DIAGNOSIS — M25611 Stiffness of right shoulder, not elsewhere classified: Secondary | ICD-10-CM

## 2016-01-11 DIAGNOSIS — M542 Cervicalgia: Secondary | ICD-10-CM

## 2016-01-11 NOTE — Therapy (Signed)
[facility] [address] [place], [place], [address] Phone: [phone]   Fax:  [phone]  Physical Therapy Treatment  Patient Details  Name: Allison Campos MRN: [id number] Date of Birth: [birth year] Referring Provider: Dr. Betty Martinique  Encounter Date: 01/11/2016      PT End of Session - 01/11/16 0947    Visit Number 26   Date for PT Re-Evaluation 02/03/16   PT Start Time 0931   PT Stop Time 1030   PT Time Calculation (min) 59 min   Activity Tolerance Patient tolerated treatment well   Behavior During Therapy [facility] for tasks assessed/performed      Past Medical History:  Diagnosis Date  . 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 Alcoholism in family   . GOITER, MULTINODULAR 01/08/2008  . Grave's disease   . Hyperthyroidism   . HYPOTHYROIDISM, POST-RADIATION 07/19/2008    Past Surgical History:  Procedure Laterality Date  . CESAREAN SECTION     x2  . FOOT SURGERY     to remove sewing needle  . LAPAROSCOPY     Endometriosis  . SHOULDER ARTHROSCOPY     left    There were no vitals filed for this visit.      Subjective Assessment - 01/11/16 0943    Subjective Went to gym this AM, neck hurts just a little after, overall doing well: working 8 hour days now. Did SPin class this AM.   Currently in Pain? Yes   Pain Score 2    Pain Location Neck   Pain Orientation Right   Pain Descriptors / Indicators Aching   Aggravating Factors  Bending over   Pain Relieving Factors rest   Multiple Pain Sites No                         [facility] Adult PT Treatment/Exercise - 01/11/16 0001      Shoulder Exercises: Seated   Other Seated Exercises 3 way raise 1# 12x  2# on second set     Shoulder Exercises: ROM/Strengthening   UBE (Upper Arm Bike) L2 3x3   Sitting on green ball     Shoulder Exercises: Stretch   Other Shoulder Stretches Upper flexion stretch in door frame 3x 30 sec     Shoulder Exercises: Power [employer] 20 reps  20# standing     Moist Heat Therapy   Number Minutes Moist Heat 15 Minutes   Moist Heat Location Cervical     Electrical Stimulation   Electrical Stimulation Location Cervical    Electrical Stimulation Action IFC   Electrical Stimulation Parameters 15    Electrical Stimulation Goals Pain                  PT Short Term Goals - 11/16/15 0848      PT SHORT TERM GOAL #5   Title report a 25% reduction in neck and shoulder pain with sitting and ADLs   Time 4   Period Weeks   Status Achieved           PT Long Term Goals - 01/11/16 [id number]      PT LONG TERM GOAL #6   Title return to regular exercise routine without limitation   Time 6   Period Weeks   Status On-going  Able to do more of her SPIN class but still only doing aboiut a 1/3 of the intensity     PT LONG  TERM GOAL #7   Title demonstrate full Rt shoulder AROM without significant increase in pain   Time 8   Period Weeks   Status On-going  140 degrees of shoulder flexion vs 158 on the LT     PT LONG TERM GOAL #8   Title work a full work day without limitation   Time 6   Period Weeks   Status Achieved               Plan - 01/11/16 1011    Clinical Impression Statement Pt is now working an 8 hour day. Some soreness in neck which is usually relieved by rest. She is planning on seeing a neurologist for her headaches.  Increased weights for the shoulders today.    Rehab Potential Good   Clinical Impairments Affecting Rehab Potential None   PT Frequency 2x / week   PT Duration 6 weeks   PT Treatment/Interventions ADLs/Self Care Home Management;Cryotherapy;Electrical Stimulation;Iontophoresis 4mg /ml Dexamethasone;Moist Heat;Therapeutic exercise;Therapeutic activities;Functional mobility training;Ultrasound;Neuromuscular re-education;Traction;Patient/family education;Manual techniques;Taping;Dry needling;Passive range of motion   PT Next Visit Plan Manual  to neck and Rt shoulder, strength and endurance progression, modalities   Consulted and Agree with Plan of Care Patient      Patient will benefit from skilled therapeutic intervention in order to improve the following deficits and impairments:  Postural dysfunction, Decreased strength, Improper body mechanics, Impaired flexibility, Pain, Decreased activity tolerance, Decreased endurance, Increased muscle spasms, Decreased range of motion  Visit Diagnosis: Stiffness of right shoulder, not elsewhere classified  Pain in right shoulder  Cervicalgia     Problem List Patient Active Problem List   Diagnosis Date Noted  . Fatigue 09/02/2015  . Hx of adenomatous colonic polyps 09/29/2014  . Low back pain 09/29/2014  . Former smoker 09/29/2014  . Internal hemorrhoids 10/13/2012  . 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 GOITER, MULTINODULAR 01/08/2008  . ADJUSTMENT DISORDER WITH MIXED FEATURES 10/24/2006    Allison Campos, PTA 01/11/2016, 10:13 AM  [facility] [address], [place], [place], [address] Phone: [phone]   Fax:  [phone]  Name: Allison Campos MRN: [id number] Date of Birth: [birth year]

## 2016-01-13 ENCOUNTER — Encounter: Payer: Self-pay | Admitting: Physical Therapy

## 2016-01-13 ENCOUNTER — Ambulatory Visit: Payer: BLUE CROSS/BLUE SHIELD | Admitting: Physical Therapy

## 2016-01-13 DIAGNOSIS — M546 Pain in thoracic spine: Secondary | ICD-10-CM | POA: Diagnosis not present

## 2016-01-13 DIAGNOSIS — M25611 Stiffness of right shoulder, not elsewhere classified: Secondary | ICD-10-CM | POA: Diagnosis not present

## 2016-01-13 DIAGNOSIS — M542 Cervicalgia: Secondary | ICD-10-CM | POA: Diagnosis not present

## 2016-01-13 DIAGNOSIS — M25511 Pain in right shoulder: Secondary | ICD-10-CM | POA: Diagnosis not present

## 2016-01-13 NOTE — Therapy (Signed)
[facility] [address], [place], [place], [address] Phone: [phone]   Fax:  [phone]  Physical Therapy Treatment  Patient Details  Name: Allison Campos MRN: [id number] Date of Birth: Mar 08, 1966 Referring Provider: Dr. Betty Martinique  Encounter Date: 01/13/2016      PT End of Session - 01/13/16 0801    Visit Number 27   Date for PT Re-Evaluation 02/03/16   PT Start Time 0755   PT Stop Time 0855   PT Time Calculation (min) 60 min   Activity Tolerance Patient tolerated treatment well   Behavior During Therapy [facility] for tasks assessed/performed      Past Medical History:  Diagnosis Date  . 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 Alcoholism in family   . GOITER, MULTINODULAR 01/08/2008  . Grave's disease   . Hyperthyroidism   . HYPOTHYROIDISM, POST-RADIATION 07/19/2008    Past Surgical History:  Procedure Laterality Date  . CESAREAN SECTION     x2  . FOOT SURGERY     to remove sewing needle  . LAPAROSCOPY     Endometriosis  . SHOULDER ARTHROSCOPY     left    There were no vitals filed for this visit.      Subjective Assessment - 01/13/16 0759    Subjective Keeping a HA diary for when she sees neurologist. Is currently waiting for call from their office. Did well after PT session, but had "killer" Ha yesterday.    Currently in Pain? Yes   Pain Score 2    Pain Location Neck   Pain Orientation --  Base of neck   Pain Descriptors / Indicators Aching   Multiple Pain Sites No                         [facility] Adult PT Treatment/Exercise - 01/13/16 0001      Neck Exercises: Supine   Other Supine Exercise Decompression on foam roll x  2 min then green band horoizontal abd 2x10, diagonals bil 2x10   Other Supine Exercise Cervical release with foam roll  MELT Method protocol     Shoulder Exercises: Seated   Other Seated Exercises 3 way raise 3# 12x  2# on second set     Shoulder Exercises: Standing   Other Standing Exercises wall push ups 2x 10     Shoulder Exercises: ROM/Strengthening   UBE (Upper Arm Bike) L2 3x3   Sitting on green ball     Shoulder Exercises: Stretch   Other Shoulder Stretches Upper flexion stretch in door frame 3x 30 sec     Moist Heat Therapy   Number Minutes Moist Heat 15 Minutes   Moist Heat Location Cervical     Electrical Stimulation   Electrical Stimulation Location Cervical    Electrical Stimulation Action IFC   Electrical Stimulation Parameters 15   Electrical Stimulation Goals Pain                  PT Short Term Goals - 11/16/15 0848      PT SHORT TERM GOAL #5   Title report a 25% reduction in neck and shoulder pain with sitting and ADLs   Time 4   Period Weeks   Status Achieved           PT Long Term Goals - 01/11/16 [id number]      PT LONG TERM GOAL #6   Title return to regular exercise routine without  limitation   Time 6   Period Weeks   Status On-going  Able to do more of her SPIN class but still only doing aboiut a 1/3 of the intensity     PT LONG TERM GOAL #7   Title demonstrate full Rt shoulder AROM without significant increase in pain   Time 8   Period Weeks   Status On-going  140 degrees of shoulder flexion vs 158 on the LT     PT LONG TERM GOAL #8   Title work a full work day without limitation   Time 6   Period Weeks   Status Achieved               Plan - 01/13/16 0801    Clinical Impression Statement Pt is awaiting an appointment with the neurologist to discuss her current HA situation. Weights doing well for bil shoulders, no increased neck pain.    Rehab Potential Good   Clinical Impairments Affecting Rehab Potential None   PT Frequency 2x / week   PT Duration 6 weeks   PT Treatment/Interventions ADLs/Self Care Home Management;Cryotherapy;Electrical Stimulation;Iontophoresis 4mg /ml Dexamethasone;Moist Heat;Therapeutic exercise;Therapeutic activities;Functional  mobility training;Ultrasound;Neuromuscular re-education;Traction;Patient/family education;Manual techniques;Taping;Dry needling;Passive range of motion   PT Next Visit Plan  Strength and endurance progression, modalities   Consulted and Agree with Plan of Care Patient      Patient will benefit from skilled therapeutic intervention in order to improve the following deficits and impairments:  Postural dysfunction, Decreased strength, Improper body mechanics, Impaired flexibility, Pain, Decreased activity tolerance, Decreased endurance, Increased muscle spasms, Decreased range of motion  Visit Diagnosis: Stiffness of right shoulder, not elsewhere classified  Pain in right shoulder  Cervicalgia     Problem List Patient Active Problem List   Diagnosis Date Noted  . Fatigue 09/02/2015  . Hx of adenomatous colonic polyps 09/29/2014  . Low back pain 09/29/2014  . Former smoker 09/29/2014  . Internal hemorrhoids 10/13/2012  . 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 GOITER, MULTINODULAR 01/08/2008  . ADJUSTMENT DISORDER WITH MIXED FEATURES 10/24/2006    Lysbeth Dicola, PTA 01/13/2016, 8:38 AM   [facility] [address], [place], [place], [address] Phone: [phone]   Fax:  [phone]  Name: Allison Campos MRN: [id number] Date of Birth: 1966/01/07

## 2016-01-16 ENCOUNTER — Ambulatory Visit: Payer: BLUE CROSS/BLUE SHIELD | Admitting: Physical Therapy

## 2016-01-16 ENCOUNTER — Encounter: Payer: Self-pay | Admitting: Physical Therapy

## 2016-01-16 DIAGNOSIS — M25511 Pain in right shoulder: Secondary | ICD-10-CM

## 2016-01-16 DIAGNOSIS — M542 Cervicalgia: Secondary | ICD-10-CM

## 2016-01-16 DIAGNOSIS — M546 Pain in thoracic spine: Secondary | ICD-10-CM | POA: Diagnosis not present

## 2016-01-16 DIAGNOSIS — M25611 Stiffness of right shoulder, not elsewhere classified: Secondary | ICD-10-CM

## 2016-01-16 NOTE — Therapy (Signed)
[facility] [address] [place], [place], [address] Phone: [phone]   Fax:  [phone]  Physical Therapy Treatment  Patient Details  Name: Allison Campos MRN: [id number] Date of Birth: November 09, 1965 Referring Provider: Dr. Betty Martinique  Encounter Date: 01/16/2016      PT End of Session - 01/16/16 0803    Visit Number 28   Date for PT Re-Evaluation 02/03/16   PT Start Time 0758   PT Stop Time 0850   PT Time Calculation (min) 52 min   Activity Tolerance Patient tolerated treatment well   Behavior During Therapy [facility] for tasks assessed/performed      Past Medical History:  Diagnosis Date  . 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 Alcoholism in family   . GOITER, MULTINODULAR 01/08/2008  . Grave's disease   . Hyperthyroidism   . HYPOTHYROIDISM, POST-RADIATION 07/19/2008    Past Surgical History:  Procedure Laterality Date  . CESAREAN SECTION     x2  . FOOT SURGERY     to remove sewing needle  . LAPAROSCOPY     Endometriosis  . SHOULDER ARTHROSCOPY     left    There were no vitals filed for this visit.      Subjective Assessment - 01/16/16 0801    Subjective No new complaints this AM. Pt is not sure of the foam roller exercises on her neck are helpful or create a deep ache?   Currently in Pain? No/denies   Multiple Pain Sites No                         [facility] Adult PT Treatment/Exercise - 01/16/16 0001      Shoulder Exercises: Seated   Other Seated Exercises 3 way raise 4# 10x  Sitting on green ball     Shoulder Exercises: Standing   Other Standing Exercises wall push ups 3x 10  End range shoulder flexion stretch in between sets.      Shoulder Exercises: ROM/Strengthening   UBE (Upper Arm Bike) L2 3x3   Sitting on green ball     Moist Heat Therapy   Number Minutes Moist Heat 15 Minutes   Moist Heat Location Cervical     Electrical Stimulation   Electrical  Stimulation Location Cervical    Electrical Stimulation Action IFC   Electrical Stimulation Parameters 15   Electrical Stimulation Goals Pain     Manual Therapy   Soft tissue mobilization cervical, suboccipitals, UT                  PT Short Term Goals - 11/16/15 0848      PT SHORT TERM GOAL #5   Title report a 25% reduction in neck and shoulder pain with sitting and ADLs   Time 4   Period Weeks   Status Achieved           PT Long Term Goals - 01/16/16 [id number]      PT LONG TERM GOAL #6   Title return to regular exercise routine without limitation   Time 6   Period Weeks   Status On-going               Plan - 01/16/16 [id number]    Clinical Impression Statement Pt was able to jog a little this weekend without any HA. This was encouraging to her in terms of increasing her exercise intensity. Pt continues to do well increasing weights for bil  UE.    PT Next Visit Plan  Strength and endurance progression, modalities      Patient will benefit from skilled therapeutic intervention in order to improve the following deficits and impairments:  Postural dysfunction, Decreased strength, Improper body mechanics, Impaired flexibility, Pain, Decreased activity tolerance, Decreased endurance, Increased muscle spasms, Decreased range of motion  Visit Diagnosis: Stiffness of right shoulder, not elsewhere classified  Pain in right shoulder  Cervicalgia     Problem List Patient Active Problem List   Diagnosis Date Noted  . Fatigue 09/02/2015  . Hx of adenomatous colonic polyps 09/29/2014  . Low back pain 09/29/2014  . Former smoker 09/29/2014  . Internal hemorrhoids 10/13/2012  . 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 GOITER, MULTINODULAR 01/08/2008  . ADJUSTMENT DISORDER WITH MIXED FEATURES 10/24/2006    Antjuan Rothe, PTA 01/16/2016, 8:45 AM  [facility] [address] [place], [place],  [address] Phone: [phone]   Fax:  [phone]  Name: Allison Campos MRN: [id number] Date of Birth: June 20, 1965

## 2016-01-20 ENCOUNTER — Ambulatory Visit: Payer: BLUE CROSS/BLUE SHIELD | Admitting: Physical Therapy

## 2016-01-20 ENCOUNTER — Encounter: Payer: Self-pay | Admitting: Physical Therapy

## 2016-01-20 DIAGNOSIS — M542 Cervicalgia: Secondary | ICD-10-CM

## 2016-01-20 DIAGNOSIS — M546 Pain in thoracic spine: Secondary | ICD-10-CM

## 2016-01-20 DIAGNOSIS — M25511 Pain in right shoulder: Secondary | ICD-10-CM

## 2016-01-20 DIAGNOSIS — M25611 Stiffness of right shoulder, not elsewhere classified: Secondary | ICD-10-CM | POA: Diagnosis not present

## 2016-01-20 NOTE — Therapy (Signed)
[facility] [address] [place], [place], [address] Phone: [phone]   Fax:  [phone]  Physical Therapy Treatment  Patient Details  Name: Allison Campos MRN: [id number] Date of Birth: [birth year] Referring Provider: Dr. Betty Martinique  Encounter Date: 01/20/2016      PT End of Session - 01/20/16 0802    Visit Number 29   Date for PT Re-Evaluation 02/03/16   PT Start Time 0800   PT Stop Time 0900   PT Time Calculation (min) 60 min   Activity Tolerance Patient tolerated treatment well   Behavior During Therapy [facility] for tasks assessed/performed      Past Medical History:  Diagnosis Date  . 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 Alcoholism in family   . GOITER, MULTINODULAR 01/08/2008  . Grave's disease   . Hyperthyroidism   . HYPOTHYROIDISM, POST-RADIATION 07/19/2008    Past Surgical History:  Procedure Laterality Date  . CESAREAN SECTION     x2  . FOOT SURGERY     to remove sewing needle  . LAPAROSCOPY     Endometriosis  . SHOULDER ARTHROSCOPY     left    There were no vitals filed for this visit.      Subjective Assessment - 01/20/16 0801    Subjective I have not had any increased HA intensity after working out the last few days. Sees neurologist when Monday.    Currently in Pain? No/denies   Multiple Pain Sites No            [facility] PT Assessment - 01/20/16 0001      Strength   Overall Strength --  Grip strength LT: 58# RT 25#, pt is LT hand dominant                     [facility] Adult PT Treatment/Exercise - 01/20/16 0001      Neck Exercises: Supine   Other Supine Exercise green band scap unattached 2x15 bil     Shoulder Exercises: Seated   Other Seated Exercises 3 way raise 4# 10x3    Sitting on green ball     Shoulder Exercises: Standing   Other Standing Exercises wall push ups 3x 10  End range shoulder flexion stretch in between sets.      Shoulder  Exercises: ROM/Strengthening   UBE (Upper Arm Bike) L2 4x4 sitting on ball     Shoulder Exercises: Stretch   Other Shoulder Stretches Upper flexion stretch in door frame 3x 30 sec     Moist Heat Therapy   Number Minutes Moist Heat 15 Minutes   Moist Heat Location Cervical     Electrical Stimulation   Electrical Stimulation Location Cervical    Electrical Stimulation Action IFC   Electrical Stimulation Goals Pain     Manual Therapy   Soft tissue mobilization cervical, suboccipitals, UT  Staticx stretching for rotation bil   Myofascial Release Nerve flossing RTUE,                   PT Short Term Goals - 11/16/15 0848      PT SHORT TERM GOAL #5   Title report a 25% reduction in neck and shoulder pain with sitting and ADLs   Time 4   Period Weeks   Status Achieved           PT Long Term Goals - 01/16/16 0804      PT LONG TERM GOAL #6  Title return to regular exercise routine without limitation   Time 6   Period Weeks   Status On-going               Plan - 01/20/16 [id number]    Clinical Impression Statement Pt experienced some lightheadness when on the arm bike today, had to slow down then eventually stop. Pt also had pain in her hand when doing the arm bike. She will report these to the neurologist Monday.    PT Next Visit Plan See what neurologist says.       Patient will benefit from skilled therapeutic intervention in order to improve the following deficits and impairments:  Postural dysfunction, Decreased strength, Improper body mechanics, Pain, Decreased activity tolerance, Decreased endurance, Increased muscle spasms, Decreased range of motion  Visit Diagnosis: Stiffness of right shoulder, not elsewhere classified  Pain in right shoulder  Cervicalgia  Pain in thoracic spine     Problem List Patient Active Problem List   Diagnosis Date Noted  . Fatigue 09/02/2015  . Hx of adenomatous colonic polyps 09/29/2014  . Low back pain  09/29/2014  . Former smoker 09/29/2014  . Internal hemorrhoids 10/13/2012  . 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 GOITER, MULTINODULAR 01/08/2008  . ADJUSTMENT DISORDER WITH MIXED FEATURES 10/24/2006    Taniqua Issa, PTA 01/20/2016, 8:48 AM  [facility] [address] [place], [place], [address] Phone: [phone]   Fax:  [phone]  Name: Allison Campos MRN: [id number] Date of Birth: 10-Feb-1966

## 2016-01-23 ENCOUNTER — Ambulatory Visit: Payer: BLUE CROSS/BLUE SHIELD | Admitting: Physical Therapy

## 2016-01-23 ENCOUNTER — Ambulatory Visit (INDEPENDENT_AMBULATORY_CARE_PROVIDER_SITE_OTHER): Payer: BLUE CROSS/BLUE SHIELD | Admitting: Diagnostic Neuroimaging

## 2016-01-23 ENCOUNTER — Encounter: Payer: Self-pay | Admitting: Diagnostic Neuroimaging

## 2016-01-23 ENCOUNTER — Encounter: Payer: Self-pay | Admitting: Physical Therapy

## 2016-01-23 VITALS — BP 124/73 | HR 59 | Ht 64.0 in | Wt 155.6 lb

## 2016-01-23 DIAGNOSIS — G44309 Post-traumatic headache, unspecified, not intractable: Secondary | ICD-10-CM | POA: Diagnosis not present

## 2016-01-23 DIAGNOSIS — M546 Pain in thoracic spine: Secondary | ICD-10-CM | POA: Diagnosis not present

## 2016-01-23 DIAGNOSIS — F0781 Postconcussional syndrome: Secondary | ICD-10-CM | POA: Diagnosis not present

## 2016-01-23 DIAGNOSIS — M542 Cervicalgia: Secondary | ICD-10-CM | POA: Diagnosis not present

## 2016-01-23 DIAGNOSIS — M25611 Stiffness of right shoulder, not elsewhere classified: Secondary | ICD-10-CM

## 2016-01-23 DIAGNOSIS — M79641 Pain in right hand: Secondary | ICD-10-CM | POA: Diagnosis not present

## 2016-01-23 DIAGNOSIS — M25511 Pain in right shoulder: Secondary | ICD-10-CM

## 2016-01-23 MED ORDER — AMITRIPTYLINE HCL 25 MG PO TABS
25.0000 mg | ORAL_TABLET | Freq: Every day | ORAL | 6 refills | Status: DC
Start: 2016-01-23 — End: 2016-04-27

## 2016-01-23 NOTE — Patient Instructions (Signed)
Thank you for coming to see [place] at [facility]. I hope we have been able to provide you high quality care today.  You may receive a patient satisfaction survey over the next few weeks. We would appreciate your feedback and comments so that we may continue to improve ourselves and the health of our patients.  - start amitriptyline '25mg'$  at bedtime  - use ibuprofen and tylenol as needed for headache and pain  - gradually increase activity  - To prevent or relieve headaches, try the following:   Cool Compress. Lie down and place a cool compress on your head.   Avoid headache triggers. If certain foods or odors seem to have triggered your migraines in the past, avoid them. A headache diary might help you identify triggers.   Include physical activity in your daily routine.   Manage stress. Find healthy ways to cope with the stressors, such as delegating tasks on your to-do list.   Practice relaxation techniques. Try deep breathing, yoga, massage and visualization.   Eat regularly. Eating regularly scheduled meals and maintaining a healthy diet might help prevent headaches. Also, drink plenty of fluids.   Follow a regular sleep schedule. Sleep deprivation might contribute to headaches  Consider biofeedback. With this mind-body technique, you learn to control certain bodily functions - such as muscle tension, heart rate and blood pressure - to prevent headaches or reduce headache pain.    ~~~~~~~~~~~~~~~~~~~~~~~~~~~~~~~~~~~~~~~~~~~~~~~~~~~~~~~~~~~~~~~~~  DR. PENUMALLI'S GUIDE TO HAPPY AND HEALTHY LIVING These are some of my general health and wellness recommendations. Some of them may apply to you better than others. Please use common sense as you try these suggestions and feel free to ask me any questions.   ACTIVITY/FITNESS Mental, social, emotional and physical stimulation are very important for brain and body health. Try learning a new activity (arts, music,  language, sports, games).  Keep moving your body to the best of your abilities. You can do this at home, inside or outside, the park, community center, gym or anywhere you like. Consider a physical therapist or personal trainer to get started. Consider the app Sworkit. Fitness trackers such as smart-watches, smart-phones or Fitbits can help as well.   NUTRITION Eat more plants: colorful vegetables, nuts, seeds and berries.  Eat less sugar, salt, preservatives and processed foods.  Avoid toxins such as cigarettes and alcohol.  Drink water when you are thirsty. Warm water with a slice of lemon is an excellent morning drink to start the day.  Consider these websites for more information The Nutrition Source (https://www.henry-hernandez.biz/) Precision Nutrition (WindowBlog.ch)   RELAXATION Consider practicing mindfulness meditation or other relaxation techniques such as deep breathing, prayer, yoga, tai chi, massage. See website mindful.org or the apps Headspace or Calm to help get started.   SLEEP Try to get at least 7-8+ hours sleep per day. Regular exercise and reduced caffeine will help you sleep better. Practice good sleep hygeine techniques. See website sleep.org for more information.   PLANNING Prepare estate planning, living will, healthcare POA documents. Sometimes this is best planned with the help of an attorney. Theconversationproject.org and agingwithdignity.org are excellent resources.

## 2016-01-23 NOTE — Therapy (Signed)
[facility] [address] [place], [place], [address] Phone: [phone]   Fax:  [phone]  Physical Therapy Treatment  Patient Details  Name: Allison Campos MRN: [id number] Date of Birth: Apr 10, 1966 Referring Provider: Dr. Betty Martinique  Encounter Date: 01/23/2016      PT End of Session - 01/23/16 0800    Visit Number 30   Date for PT Re-Evaluation 02/03/16   PT Start Time 0800   PT Stop Time 0900   PT Time Calculation (min) 60 min   Activity Tolerance Patient tolerated treatment well   Behavior During Therapy [facility] for tasks assessed/performed      Past Medical History:  Diagnosis Date  . 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 Alcoholism in family   . GOITER, MULTINODULAR 01/08/2008  . Grave's disease   . Hyperthyroidism   . HYPOTHYROIDISM, POST-RADIATION 07/19/2008    Past Surgical History:  Procedure Laterality Date  . CESAREAN SECTION     x2  . FOOT SURGERY     to remove sewing needle  . LAPAROSCOPY     Endometriosis  . SHOULDER ARTHROSCOPY     left    There were no vitals filed for this visit.      Subjective Assessment - 01/23/16 0802    Subjective Pt did ok over the weekend. No significant HA to report. Had some lightheadedness in step class.    Currently in Pain? No/denies   Aggravating Factors  Not sure?   Pain Relieving Factors Rest   Multiple Pain Sites No            [facility] PT Assessment - 01/23/16 0001      Observation/Other Assessments   Focus on Therapeutic Outcomes (FOTO)  33% limitations                     [facility] Adult PT Treatment/Exercise - 01/23/16 0001      Neck Exercises: Seated   Other Seated Exercise Cervical rotation stretch bil 3 x 20 sec     Shoulder Exercises: Seated   Other Seated Exercises 3 way raise 4# 10x3    Sitting on mat table today.    Other Seated Exercises Biceps 5# 2 x10      Shoulder Exercises: Standing   Retraction --  Wall pushups 3 x10    Other Standing Exercises Neural tension stretch on wall 3 x 10 sec   Other Standing Exercises Eliptical: L2 x      Shoulder Exercises: ROM/Strengthening   UBE (Upper Arm Bike) L3 sitting on ball: 3 min before RT hand statred to hurt.  Stopped at 3 min secondary to the pain.      Moist Heat Therapy   Number Minutes Moist Heat 15 Minutes   Moist Heat Location Cervical     Electrical Stimulation   Electrical Stimulation Location Cervical    Electrical Stimulation Action IFC   Electrical Stimulation Goals Pain     Manual Therapy   Soft tissue mobilization cervical, suboccipitals, UT  Staticx stretching for rotation bil                PT Education - 01/23/16 0814    Education provided Yes   Education Details Cervical rotation stretch for HEP   Person(s) Educated Patient   Methods Explanation;Demonstration;Tactile cues;Verbal cues;Handout   Comprehension Verbalized understanding;Returned demonstration          PT Short Term Goals - 11/16/15 [id number]  PT SHORT TERM GOAL #5   Title report a 25% reduction in neck and shoulder pain with sitting and ADLs   Time 4   Period Weeks   Status Achieved           PT Long Term Goals - 01/23/16 [id number]      PT LONG TERM GOAL #1   Title be independent in advanced HEP   Time 6   Period Weeks     PT LONG TERM GOAL #3   Title demonstrate full cervical AROM without to improve safety with driving and ease with ADLs   Time 8   Period Weeks   Status Achieved     PT LONG TERM GOAL #6   Title return to regular exercise routine without limitation   Time 6   Period Weeks   Status On-going  Slowly increasing intensity but still no where to her baseline.               Plan - 01/23/16 0801    Clinical Impression Statement Pt continues to increase her load/intensity with her workouts very slowly. Most days she does well, but still experiences lightheadedness and headaches.  Started  some neural tension stretching today during treatment which pt reported felt painful initially then got to where it felt good.    Rehab Potential Good   Clinical Impairments Affecting Rehab Potential None   PT Frequency 2x / week   PT Duration 6 weeks   PT Next Visit Plan Going to neurologist after this appt. See what they say. Do neural tension testing for RTUE next.       Patient will benefit from skilled therapeutic intervention in order to improve the following deficits and impairments:  Postural dysfunction, Decreased strength, Improper body mechanics, Pain, Decreased activity tolerance, Decreased endurance, Increased muscle spasms, Decreased range of motion  Visit Diagnosis: Stiffness of right shoulder, not elsewhere classified  Pain in right shoulder  Cervicalgia     Problem List Patient Active Problem List   Diagnosis Date Noted  . Fatigue 09/02/2015  . Hx of adenomatous colonic polyps 09/29/2014  . Low back pain 09/29/2014  . Former smoker 09/29/2014  . Internal hemorrhoids 10/13/2012  . 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 GOITER, MULTINODULAR 01/08/2008  . ADJUSTMENT DISORDER WITH MIXED FEATURES 10/24/2006    Harrol Novello, PTA 01/23/2016, 8:50 AM  [facility] [address], [place], [place], [address] Phone: [phone]   Fax:  [phone]  Name: Allison Campos MRN: [id number] Date of Birth: Jun 12, 1965

## 2016-01-23 NOTE — Patient Instructions (Signed)
Levator Stretch    Grasp seat or sit on hand on side to be stretched. Turn head toward other side and look down. Use hand on head to gently stretch neck in that position. Hold __20__ seconds. Repeat on other side. Repeat _1-3___ times. Do _3___ sessions per day.  http://[email or web address].exer.[email or web address]   Copyright  VHI. All rights reserved.
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
## 2016-01-25 ENCOUNTER — Ambulatory Visit: Payer: BLUE CROSS/BLUE SHIELD

## 2016-01-25 DIAGNOSIS — M25511 Pain in right shoulder: Secondary | ICD-10-CM | POA: Diagnosis not present

## 2016-01-25 DIAGNOSIS — M546 Pain in thoracic spine: Secondary | ICD-10-CM | POA: Diagnosis not present

## 2016-01-25 DIAGNOSIS — M25611 Stiffness of right shoulder, not elsewhere classified: Secondary | ICD-10-CM | POA: Diagnosis not present

## 2016-01-25 DIAGNOSIS — M542 Cervicalgia: Secondary | ICD-10-CM | POA: Diagnosis not present

## 2016-01-25 NOTE — Patient Instructions (Addendum)
Do 2x10 on both arms.  3x/wk   Scapular Retraction (Prone)  Lie with arms above head. Pinch shoulder blades together. Repeat ____ times per set. Do ____ sets per session. Do ____ sessions per day.  Scapular Retraction: Abduction (Prone)  Lie with upper arms straight out from sides, elbows bent to 90. Pinch shoulder blades together and raise arms a few inches from floor. Repeat ____ times per set. Do ____ sets per session. Do ____ sessions per day.  Scapular Retraction: Abduction / Extension (Prone)  Lie with arms out from sides 90. Pinch shoulder blades together and raise arms a few inches from floor. Repeat ____ times per set. Do ____ sets per session. Do ____ sessions per day.   Extension - Prone (Dumbbell)    Lie with right arm hanging off side of bed. Lift hand back and up. Repeat ____ times per set. Do ____ sets per session. Do ____ sessions per week. Use ____ lb weight.   Copyright  VHI. All rights reserved.   [place] [address] [place], [place] [address] Phone # [phone] Fax [phone]

## 2016-01-25 NOTE — Therapy (Signed)
[facility] [address] [place], [place], [address] Phone: [phone]   Fax:  [phone]  Physical Therapy Treatment  Patient Details  Name: Allison Campos MRN: [id number] Date of Birth: Oct 03, 1965 Referring Provider: Dr. Betty Martinique  Encounter Date: 01/25/2016      PT End of Session - 01/25/16 0842    Date for PT Re-Evaluation 02/03/16   PT Start Time 0758   PT Stop Time 0855   PT Time Calculation (min) 57 min   Activity Tolerance Patient tolerated treatment well   Behavior During Therapy [facility] for tasks assessed/performed      Past Medical History:  Diagnosis Date  . 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 Alcoholism in family   . GOITER, MULTINODULAR 01/08/2008  . Grave's disease   . Hyperthyroidism   . HYPOTHYROIDISM, POST-RADIATION 07/19/2008    Past Surgical History:  Procedure Laterality Date  . CESAREAN SECTION     x2, 1998, 2000  . FOOT SURGERY     to remove sewing needle, age  . LAPAROSCOPY  1993   Endometriosis  . polyp   2007   removal   . SHOULDER ARTHROSCOPY     left    There were no vitals filed for this visit.      Subjective Assessment - 01/25/16 0805    Subjective Saw neuro MD.  Trying a new medication for headaches.     Currently in Pain? No/denies                         [facility] Adult PT Treatment/Exercise - 01/25/16 0001      Shoulder Exercises: Prone   Flexion Strengthening;Both;20 reps   Extension Strengthening;Both;20 reps   Horizontal ABduction 1 Strengthening;Both;20 reps   Other Prone Exercises prayer stretch in between stretches 3x20 seconds     Shoulder Exercises: Standing   Horizontal ABduction Strengthening;Both;Theraband;20 reps   Row Strengthening;Both;20 reps;Weights  25#   Other Standing Exercises D2 with green band 2x10     Moist Heat Therapy   Number Minutes Moist Heat 15 Minutes   Moist Heat Location Cervical      Electrical Stimulation   Electrical Stimulation Location Cervical    Electrical Stimulation Action IFC   Electrical Stimulation Parameters 15 minutes   Electrical Stimulation Goals Pain     Manual Therapy   Soft tissue mobilization cervical, suboccipitals, UT  Staticx stretching for rotation bil                PT Education - 01/25/16 [id number]    Education provided Yes   Education Details green band for advancement, prone exercises   Person(s) Educated Patient   Methods Explanation;Demonstration;Handout   Comprehension Verbalized understanding;Returned demonstration          PT Short Term Goals - 11/16/15 0848      PT SHORT TERM GOAL #5   Title report a 25% reduction in neck and shoulder pain with sitting and ADLs   Time 4   Period Weeks   Status Achieved           PT Long Term Goals - 01/25/16 [id number]      PT LONG TERM GOAL #1   Title be independent in advanced HEP   Time 6   Period Weeks   Status On-going     PT LONG TERM GOAL #2   Title reduce FOTO to < or = to 34% limitation  Status Achieved     PT LONG TERM GOAL #3   Title demonstrate full cervical AROM without to improve safety with driving and ease with ADLs   Status Achieved     PT LONG TERM GOAL #4   Title get dressed with < or = to 3/10 Rt shoulder pain   Status Achieved     PT LONG TERM GOAL #5   Title report a 60% reduction in neck and shoulder pain with ADLs and work tasks   Status Achieved     PT LONG TERM GOAL #6   Title return to regular exercise routine without limitation   Time 6   Period Weeks   Status On-going               Plan - 01/25/16 0808    Clinical Impression Statement Pt reports 80% overall improvement in symptoms since the start of care.  Pt is continuing to advance her gym exercises.  Headaches are intermittent and MD has put her on a new medication to address headaches.  FOTO is 33% limitation.  Pt will benefit from 2 more sessions to advance HEP and gym  exercises.     Rehab Potential Good   PT Frequency 2x / week   PT Duration 6 weeks   PT Treatment/Interventions ADLs/Self Care Home Management;Cryotherapy;Electrical Stimulation;Iontophoresis 4mg /ml Dexamethasone;Moist Heat;Therapeutic exercise;Therapeutic activities;Functional mobility training;Ultrasound;Neuromuscular re-education;Traction;Patient/family education;Manual techniques;Taping;Dry needling;Passive range of motion   PT Next Visit Plan 1 more week.  D/C next week.     Consulted and Agree with Plan of Care Patient      Patient will benefit from skilled therapeutic intervention in order to improve the following deficits and impairments:  Postural dysfunction, Decreased strength, Improper body mechanics, Pain, Decreased activity tolerance, Decreased endurance, Increased muscle spasms, Decreased range of motion  Visit Diagnosis: Stiffness of right shoulder, not elsewhere classified  Pain in right shoulder  Cervicalgia  Pain in thoracic spine     Problem List Patient Active Problem List   Diagnosis Date Noted  . Fatigue 09/02/2015  . Hx of adenomatous colonic polyps 09/29/2014  . Low back pain 09/29/2014  . Former smoker 09/29/2014  . Internal hemorrhoids 10/13/2012  . 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 GOITER, MULTINODULAR 01/08/2008  . ADJUSTMENT DISORDER WITH MIXED FEATURES 10/24/2006     Sigurd Sos, PT 01/25/16 8:44 AM  [facility] [address], [place], [place], [address] Phone: [phone]   Fax:  [phone]  Name: Allison Campos MRN: [id number] Date of Birth: [birth year]

## 2016-01-27 ENCOUNTER — Encounter: Payer: Self-pay | Admitting: Physical Therapy

## 2016-01-27 ENCOUNTER — Ambulatory Visit: Payer: BLUE CROSS/BLUE SHIELD | Attending: Family Medicine | Admitting: Physical Therapy

## 2016-01-27 DIAGNOSIS — M25611 Stiffness of right shoulder, not elsewhere classified: Secondary | ICD-10-CM | POA: Diagnosis not present

## 2016-01-27 DIAGNOSIS — M546 Pain in thoracic spine: Secondary | ICD-10-CM | POA: Diagnosis not present

## 2016-01-27 DIAGNOSIS — M542 Cervicalgia: Secondary | ICD-10-CM | POA: Diagnosis not present

## 2016-01-27 NOTE — Therapy (Signed)
[facility] [address], [place], [place], [address] Phone: [phone]   Fax:  [phone]  Physical Therapy Treatment  Patient Details  Name: Allison Campos MRN: [id number] Date of Birth: [birth year] Referring Provider: Dr. Betty Martinique  Encounter Date: 01/27/2016      PT End of Session - 01/27/16 0854    Visit Number 31   Date for PT Re-Evaluation 02/03/16   PT Start Time 0849   PT Stop Time 0948   PT Time Calculation (min) 59 min   Activity Tolerance Patient tolerated treatment well   Behavior During Therapy [facility] for tasks assessed/performed      Past Medical History:  Diagnosis Date  . 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 Alcoholism in family   . GOITER, MULTINODULAR 01/08/2008  . Grave's disease   . Hyperthyroidism   . HYPOTHYROIDISM, POST-RADIATION 07/19/2008    Past Surgical History:  Procedure Laterality Date  . CESAREAN SECTION     x2, 1998, 2000  . FOOT SURGERY     to remove sewing needle, age  . LAPAROSCOPY  1993   Endometriosis  . polyp   2007   removal   . SHOULDER ARTHROSCOPY     left    There were no vitals filed for this visit.      Subjective Assessment - 01/27/16 0855    Subjective No problems with prone exercises.   Currently in Pain? No/denies   Multiple Pain Sites No                         [facility] Adult PT Treatment/Exercise - 01/27/16 0001      Shoulder Exercises: Prone   Flexion Strengthening;Both;20 reps   Extension Strengthening;Both;20 reps   Horizontal ABduction 1 Strengthening;Both;20 reps     Shoulder Exercises: Standing   Horizontal ABduction Strengthening;Both;Theraband;20 reps  green band   Other Standing Exercises D2 red 2x 10      Shoulder Exercises: ROM/Strengthening   UBE (Upper Arm Bike) L1 sitting on ball 6 min total 3x 3     Shoulder Exercises: Power [employer] --  25# 2 x15, VC to engage core    Other Power  [employer] Incline press 1 plate 10x      Moist Heat Therapy   Number Minutes Moist Heat 15 Minutes   Moist Heat Location Cervical     Acupuncturist Location Cervical    Electrical Stimulation Action IFC   Electrical Stimulation Parameters 15   Electrical Stimulation Goals Pain     Manual Therapy   Soft tissue mobilization cervical, suboccipitals, UT  Staticx stretching for rotation bil                  PT Short Term Goals - 11/16/15 0848      PT SHORT TERM GOAL #5   Title report a 25% reduction in neck and shoulder pain with sitting and ADLs   Time 4   Period Weeks   Status Achieved           PT Long Term Goals - 01/25/16 [id number]      PT LONG TERM GOAL #1   Title be independent in advanced HEP   Time 6   Period Weeks   Status On-going     PT LONG TERM GOAL #2   Title reduce FOTO to < or = to 34% limitation   Status  Achieved     PT LONG TERM GOAL #3   Title demonstrate full cervical AROM without to improve safety with driving and ease with ADLs   Status Achieved     PT LONG TERM GOAL #4   Title get dressed with < or = to 3/10 Rt shoulder pain   Status Achieved     PT LONG TERM GOAL #5   Title report a 60% reduction in neck and shoulder pain with ADLs and work tasks   Status Achieved     PT LONG TERM GOAL #6   Title return to regular exercise routine without limitation   Time 6   Period Weeks   Status On-going               Plan - 01/27/16 0854    Clinical Impression Statement Pt advancing her gym program well this week; no increased pain/symtpoms. pt is prepared for her DC next week.    Rehab Potential Good   Clinical Impairments Affecting Rehab Potential None   PT Frequency 2x / week   PT Duration 6 weeks   PT Treatment/Interventions ADLs/Self Care Home Management;Cryotherapy;Electrical Stimulation;Iontophoresis 4mg /ml Dexamethasone;Moist Heat;Therapeutic exercise;Therapeutic  activities;Functional mobility training;Ultrasound;Neuromuscular re-education;Traction;Patient/family education;Manual techniques;Taping;Dry needling;Passive range of motion   PT Next Visit Plan DC next visit   Consulted and Agree with Plan of Care Patient      Patient will benefit from skilled therapeutic intervention in order to improve the following deficits and impairments:  Postural dysfunction, Decreased strength, Improper body mechanics, Pain, Decreased activity tolerance, Decreased endurance, Increased muscle spasms, Decreased range of motion  Visit Diagnosis: Stiffness of right shoulder, not elsewhere classified  Cervicalgia  Pain in thoracic spine     Problem List Patient Active Problem List   Diagnosis Date Noted  . Fatigue 09/02/2015  . Hx of adenomatous colonic polyps 09/29/2014  . Low back pain 09/29/2014  . Former smoker 09/29/2014  . Internal hemorrhoids 10/13/2012  . 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 GOITER, MULTINODULAR 01/08/2008  . ADJUSTMENT DISORDER WITH MIXED FEATURES 10/24/2006    Analilia Geddis, PTA 01/27/2016, 9:35 AM  [facility] [address], [place], [place], [address] Phone: [phone]   Fax:  [phone]  Name: Sailor Kallenberger MRN: [id number] Date of Birth: 13-Jul-1965

## 2016-02-01 ENCOUNTER — Ambulatory Visit: Payer: BLUE CROSS/BLUE SHIELD | Admitting: Physical Therapy

## 2016-02-01 ENCOUNTER — Encounter: Payer: Self-pay | Admitting: Physical Therapy

## 2016-02-01 DIAGNOSIS — M25611 Stiffness of right shoulder, not elsewhere classified: Secondary | ICD-10-CM | POA: Diagnosis not present

## 2016-02-01 DIAGNOSIS — M542 Cervicalgia: Secondary | ICD-10-CM | POA: Diagnosis not present

## 2016-02-01 DIAGNOSIS — M546 Pain in thoracic spine: Secondary | ICD-10-CM | POA: Diagnosis not present

## 2016-02-01 NOTE — Therapy (Addendum)
[facility] [address] [place], [place], [address] Phone: [phone]   Fax:  [phone]  Physical Therapy Treatment  Patient Details  Name: Allison Campos MRN: [id number] Date of Birth: 10-Jul-1965 Referring Provider: Dr. Betty Martinique  Encounter Date: 02/01/2016      PT End of Session - 02/01/16 0905    Visit Number 32   Date for PT Re-Evaluation 02/03/16   PT Start Time 0845   PT Stop Time 0927   PT Time Calculation (min) 42 min   Activity Tolerance Patient tolerated treatment well   Behavior During Therapy [facility] for tasks assessed/performed      Past Medical History:  Diagnosis Date  . 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 Alcoholism in family   . GOITER, MULTINODULAR 01/08/2008  . Grave's disease   . Hyperthyroidism   . HYPOTHYROIDISM, POST-RADIATION 07/19/2008    Past Surgical History:  Procedure Laterality Date  . CESAREAN SECTION     x2, 1998, 2000  . FOOT SURGERY     to remove sewing needle, age  . LAPAROSCOPY  1993   Endometriosis  . polyp   2007   removal   . SHOULDER ARTHROSCOPY     left    There were no vitals filed for this visit.      Subjective Assessment - 02/01/16 0904    Subjective Been able to increase speed of walking and do more in Spin class this week. I am 90% better.   Currently in Pain? No/denies   Multiple Pain Sites No            [facility] PT Assessment - 02/01/16 0001      Assessment   Medical Diagnosis Rt shoulder pain, Upper back pain, Cervicalgia   Onset Date/Surgical Date 10/22/15     Precautions   Precautions None     Prior Function   Level of Independence Independent     Observation/Other Assessments   Focus on Therapeutic Outcomes (FOTO)  27% limited     AROM   Right Shoulder Flexion 155 Degrees   Cervical - Right Side Bend 15   Cervical - Left Side Bend 30   Cervical - Right Rotation 50   Cervical - Left Rotation 50                      [facility] Adult PT Treatment/Exercise - 02/01/16 0001      Shoulder Exercises: Seated   Horizontal ABduction Strengthening;Both;Theraband  3x15   Theraband Level (Shoulder Horizontal ABduction) Level 3 (Green)   Other Seated Exercises 3 way raise 4# 10x3     Other Seated Exercises Biceps 5# 3 x10      Shoulder Exercises: Power [employer] --  25# 2 x15, VC to engage core    Other Power [employer] Incline press 1 plate 10x 3                  PT Short Term Goals - 11/16/15 0848      PT SHORT TERM GOAL #5   Title report a 25% reduction in neck and shoulder pain with sitting and ADLs   Time 4   Period Weeks   Status Achieved           PT Long Term Goals - 02/01/16 8338      PT LONG TERM GOAL #1   Title be independent in advanced HEP   Time 6  Period Weeks   Status Achieved     PT LONG TERM GOAL #2   Title reduce FOTO to < or = to 34% limitation   Status Achieved     PT LONG TERM GOAL #3   Title demonstrate full cervical AROM without to improve safety with driving and ease with ADLs   Status Achieved     PT LONG TERM GOAL #4   Title get dressed with < or = to 3/10 Rt shoulder pain   Status Achieved     PT LONG TERM GOAL #5   Title report a 60% reduction in neck and shoulder pain with ADLs and work tasks   Status Achieved     PT LONG TERM GOAL #6   Title return to regular exercise routine without limitation   Time 6   Period Weeks   Status On-going  remains with slight limittaions in speed and enduance. This has improved tremendously over the last week.      PT LONG TERM GOAL #7   Title demonstrate full Rt shoulder AROM without significant increase in pain   Time 8   Period Weeks   Status Achieved     PT LONG TERM GOAL #8   Title work a full work day without limitation   Status Achieved               Plan - 02/01/16 0906    Clinical Impression Statement Pt has met almost 100% of her goals. At this  time she isn't back to her old exercise routine of 2 classes/day, but she is able to do 1 class and walk or do weights and walk.  She reports her ability to "pick the pace up" has also improved especially this week.     Rehab Potential Good   Clinical Impairments Affecting Rehab Potential None   PT Frequency 2x / week   PT Duration 6 weeks   PT Treatment/Interventions ADLs/Self Care Home Management;Cryotherapy;Electrical Stimulation;Iontophoresis 58m/ml Dexamethasone;Moist Heat;Therapeutic exercise;Therapeutic activities;Functional mobility training;Ultrasound;Neuromuscular re-education;Traction;Patient/family education;Manual techniques;Taping;Dry needling;Passive range of motion   PT Next Visit Plan DC   Consulted and Agree with Plan of Care Patient      Patient will benefit from skilled therapeutic intervention in order to improve the following deficits and impairments:  Postural dysfunction, Decreased strength, Improper body mechanics, Pain, Decreased activity tolerance, Decreased endurance, Increased muscle spasms, Decreased range of motion  Visit Diagnosis: Stiffness of right shoulder, not elsewhere classified  Cervicalgia     Problem List Patient Active Problem List   Diagnosis Date Noted  . Fatigue 09/02/2015  . Hx of adenomatous colonic polyps 09/29/2014  . Low back pain 09/29/2014  . Former smoker 09/29/2014  . Internal hemorrhoids 10/13/2012  . 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 GOITER, MULTINODULAR 01/08/2008  . ADJUSTMENT DISORDER WITH MIXED FEATURES 10/24/2006    JMyrene Galas PTA 02/01/16 9:47 AM PHYSICAL THERAPY DISCHARGE SUMMARY  Visits from Start of Care: 32  Current functional level related to goals / functional outcomes: See above for current status.  Pt has returned to regular activity except for normal exercise routine.  Pt has HEP in place and instruction in gym progression.     Remaining deficits: Intermittent neck pain and headaches  that limit tolerance for endurance tasks.     Education / Equipment: HEP, posture Plan: Patient agrees to discharge.  Patient goals were partially met. Patient is being discharged due to being pleased with the current functional level.  ?????  Sigurd Sos, PT 02/01/16 9:47 AM  [facility] [address], [place], [place], [address] Phone: [phone]   Fax:  [phone]  Name: Falisa Lamora MRN: [id number] Date of Birth: 1965-07-21

## 2016-04-27 ENCOUNTER — Ambulatory Visit (INDEPENDENT_AMBULATORY_CARE_PROVIDER_SITE_OTHER): Payer: BLUE CROSS/BLUE SHIELD | Admitting: Diagnostic Neuroimaging

## 2016-04-27 ENCOUNTER — Encounter: Payer: Self-pay | Admitting: Diagnostic Neuroimaging

## 2016-04-27 VITALS — BP 125/74 | HR 66 | Wt 155.6 lb

## 2016-04-27 DIAGNOSIS — G44309 Post-traumatic headache, unspecified, not intractable: Secondary | ICD-10-CM | POA: Diagnosis not present

## 2016-04-27 DIAGNOSIS — F0781 Postconcussional syndrome: Secondary | ICD-10-CM

## 2016-04-27 MED ORDER — AMITRIPTYLINE HCL 25 MG PO TABS: 25.0000 mg | ORAL_TABLET | Freq: Every day | ORAL | 6 refills | Status: DC
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
## 2016-04-27 NOTE — Patient Instructions (Signed)
-   continue amitriptyline 25mg  at bedtime; may try tapering in future (every other day for 1-2 weeks, then stop)  - gradually increase activity

## 2016-06-27 ENCOUNTER — Other Ambulatory Visit: Payer: Self-pay | Admitting: Obstetrics and Gynecology

## 2016-06-27 DIAGNOSIS — Z1231 Encounter for screening mammogram for malignant neoplasm of breast: Secondary | ICD-10-CM

## 2016-07-18 DIAGNOSIS — L719 Rosacea, unspecified: Secondary | ICD-10-CM | POA: Diagnosis not present

## 2016-07-27 ENCOUNTER — Ambulatory Visit
Admission: RE | Admit: 2016-07-27 | Discharge: 2016-07-27 | Disposition: A | Discharge status: Home / Self Care | Payer: BLUE CROSS/BLUE SHIELD | Source: Ambulatory Visit | Attending: Obstetrics and Gynecology | Admitting: Obstetrics and Gynecology

## 2016-07-27 DIAGNOSIS — Z1231 Encounter for screening mammogram for malignant neoplasm of breast: Secondary | ICD-10-CM

## 2016-08-12 DIAGNOSIS — S83241A Other tear of medial meniscus, current injury, right knee, initial encounter: Secondary | ICD-10-CM | POA: Diagnosis not present

## 2016-09-14 DIAGNOSIS — H10413 Chronic giant papillary conjunctivitis, bilateral: Secondary | ICD-10-CM | POA: Diagnosis not present

## 2016-09-14 DIAGNOSIS — H16142 Punctate keratitis, left eye: Secondary | ICD-10-CM | POA: Diagnosis not present

## 2016-09-25 DIAGNOSIS — H16142 Punctate keratitis, left eye: Secondary | ICD-10-CM | POA: Diagnosis not present

## 2016-10-02 DIAGNOSIS — H16142 Punctate keratitis, left eye: Secondary | ICD-10-CM | POA: Diagnosis not present

## 2016-10-25 ENCOUNTER — Other Ambulatory Visit: Payer: Self-pay | Admitting: Endocrinology

## 2016-10-25 DIAGNOSIS — L821 Other seborrheic keratosis: Secondary | ICD-10-CM | POA: Diagnosis not present

## 2016-10-25 DIAGNOSIS — D1800 Hemangioma unspecified site: Secondary | ICD-10-CM | POA: Diagnosis not present

## 2016-10-25 DIAGNOSIS — D225 Melanocytic nevi of trunk: Secondary | ICD-10-CM | POA: Diagnosis not present

## 2016-10-25 DIAGNOSIS — L814 Other melanin hyperpigmentation: Secondary | ICD-10-CM | POA: Diagnosis not present

## 2016-10-26 ENCOUNTER — Ambulatory Visit: Payer: BLUE CROSS/BLUE SHIELD | Admitting: Diagnostic Neuroimaging

## 2016-10-26 NOTE — Telephone Encounter (Signed)
Please refill x 3 mos Ov is due 

## 2016-10-31 ENCOUNTER — Telehealth: Payer: Self-pay | Admitting: Family Medicine

## 2016-10-31 NOTE — Telephone Encounter (Signed)
Pharm tech calling to report that they need to change patient brand of levothyroxine (SYNTHROID, LEVOTHROID) 75 MCG tablet, to  Brand : [employer].  Please call to verify whether or not this is okay.  Thank you,  -LL

## 2016-10-31 NOTE — Telephone Encounter (Signed)
ok 

## 2016-10-31 NOTE — Telephone Encounter (Signed)
Please advise. Thank you

## 2016-11-26 DIAGNOSIS — Z01419 Encounter for gynecological examination (general) (routine) without abnormal findings: Secondary | ICD-10-CM | POA: Diagnosis not present

## 2016-11-26 DIAGNOSIS — E0590 Thyrotoxicosis, unspecified without thyrotoxic crisis or storm: Secondary | ICD-10-CM | POA: Diagnosis not present

## 2016-11-26 DIAGNOSIS — Z6824 Body mass index (BMI) 24.0-24.9, adult: Secondary | ICD-10-CM | POA: Diagnosis not present

## 2016-11-26 LAB — TSH: TSH: 2.08 (ref <=5.90)

## 2017-01-21 ENCOUNTER — Ambulatory Visit (INDEPENDENT_AMBULATORY_CARE_PROVIDER_SITE_OTHER): Payer: BLUE CROSS/BLUE SHIELD | Admitting: Psychology

## 2017-01-21 DIAGNOSIS — F4323 Adjustment disorder with mixed anxiety and depressed mood: Secondary | ICD-10-CM

## 2017-01-21 NOTE — Progress Notes (Signed)
Reason for follow-up:  Allison Campos presents for follow-up for management of a difficulty interpersonal / family issue.  She was last seen 11/22/15.  Issues discussed:    Concussion:  Headaches have finally resolved.  Issues with getting her medical bills paid.  Figuring this out.  Family situation:  Older son [name]) is a Paramedic at [employer].  Younger son [name]) just started at [employer] this year.  Considering a big change.  Is struggling with all of the details.  Not sleeping well is a big issue.  Think a lack of sleep might have produced symptoms consistent with depression (overly negative thinking, overeating, lack of energy, depressed mood) that lasted about a week.  Better now.  Recently lost 15 pounds and feels good about this.  Using weight watchers.  Not drinking alcohol during the week.    Sleep:  In bed at 8:30.  Tries to be asleep at 9:00.  Wants 9 hours of sleep.  Is getting around 5.  Big issue is early morning awakening (3:00 am).  Would be happy with a 4:30 or 5:00 am awake time.  Pushing back bed time has not helped with early morning awakening.  Thinks she needs to set the phone down sooner at night.  Read instead.  Clock watches.  Ruminates.  No other significant sleep hygiene issues.

## 2017-01-21 NOTE — Patient Instructions (Signed)
For sleep:  1.  Consider a sleep meditation for when you have an early morning awakening. 2.  Consider NOT clock watching.  You can set an alarm if you need to be up by a certain. 3.  Manage your thoughts when you do awaken in the middle of the night.  Consider keeping a "worry journal" next to your bed and right down any thoughts you have.  No need to turn on the light.  The act of writing them down means you are okay to go back to sleep.  Mental hug.  Rest now.   4.  Phones are not the friend of sleep.  Consider putting it down at least 30 minutes prior to going to bed and don't look at it during the night.

## 2017-01-21 NOTE — Assessment & Plan Note (Signed)
Overall mood is euthymic.  Affect is within normal limits.  She got tearful near the end of our meeting when we talked about feeling shame.  Does not currently meet criteria for depression.  Is taking melatonin for help with sleep with some minor improvement.  Discussed several strategies including a worry journal bedside, sleep meditation if she awakes in the middle of the night, and refraining from clock watching.  Discussed family situation. She is educating herself and is proactive.  She will call to schedule as needed.  See patient instructions for further plan.

## 2017-02-04 ENCOUNTER — Other Ambulatory Visit: Payer: Self-pay | Admitting: Endocrinology

## 2017-02-04 NOTE — Telephone Encounter (Signed)
Please refill x 1 Ov is due  

## 2017-03-25 DIAGNOSIS — M1611 Unilateral primary osteoarthritis, right hip: Secondary | ICD-10-CM | POA: Diagnosis not present

## 2017-03-25 DIAGNOSIS — M1711 Unilateral primary osteoarthritis, right knee: Secondary | ICD-10-CM | POA: Diagnosis not present

## 2017-03-25 DIAGNOSIS — M7672 Peroneal tendinitis, left leg: Secondary | ICD-10-CM | POA: Diagnosis not present

## 2017-03-27 DIAGNOSIS — M25551 Pain in right hip: Secondary | ICD-10-CM | POA: Diagnosis not present

## 2017-04-01 ENCOUNTER — Other Ambulatory Visit: Payer: Self-pay | Admitting: Endocrinology

## 2017-04-12 ENCOUNTER — Ambulatory Visit (INDEPENDENT_AMBULATORY_CARE_PROVIDER_SITE_OTHER): Payer: BLUE CROSS/BLUE SHIELD | Admitting: *Deleted

## 2017-04-12 ENCOUNTER — Encounter: Payer: Self-pay | Admitting: *Deleted

## 2017-04-12 DIAGNOSIS — Z23 Encounter for immunization: Secondary | ICD-10-CM | POA: Diagnosis not present

## 2017-04-12 NOTE — Progress Notes (Signed)
Patient received flu vaccine. No distress noted.

## 2017-04-27 DIAGNOSIS — M1990 Unspecified osteoarthritis, unspecified site: Secondary | ICD-10-CM | POA: Insufficient documentation

## 2017-04-29 ENCOUNTER — Encounter: Payer: BLUE CROSS/BLUE SHIELD | Admitting: Family Medicine

## 2017-05-30 DIAGNOSIS — K045 Chronic apical periodontitis: Secondary | ICD-10-CM | POA: Diagnosis not present

## 2017-07-01 ENCOUNTER — Ambulatory Visit (INDEPENDENT_AMBULATORY_CARE_PROVIDER_SITE_OTHER): Payer: BLUE CROSS/BLUE SHIELD | Admitting: Family Medicine

## 2017-07-01 ENCOUNTER — Encounter: Payer: Self-pay | Admitting: Family Medicine

## 2017-07-01 VITALS — BP 128/76 | HR 58 | Temp 97.7°F | Ht 64.0 in | Wt 145.6 lb

## 2017-07-01 DIAGNOSIS — E038 Other specified hypothyroidism: Secondary | ICD-10-CM | POA: Diagnosis not present

## 2017-07-01 DIAGNOSIS — F4323 Adjustment disorder with mixed anxiety and depressed mood: Secondary | ICD-10-CM | POA: Diagnosis not present

## 2017-07-01 DIAGNOSIS — Z87891 Personal history of nicotine dependence: Secondary | ICD-10-CM | POA: Diagnosis not present

## 2017-07-01 DIAGNOSIS — Z0000 Encounter for general adult medical examination without abnormal findings: Secondary | ICD-10-CM | POA: Diagnosis not present

## 2017-07-01 DIAGNOSIS — M25551 Pain in right hip: Secondary | ICD-10-CM

## 2017-07-01 DIAGNOSIS — Z86010 Personal history of colonic polyps: Secondary | ICD-10-CM

## 2017-07-01 LAB — LIPID PANEL
CHOLESTEROL: 172 mg/dL (ref 0–200)
HDL: 72.7 mg/dL (ref >39.00)
LDL CALC: 89 mg/dL (ref 0–99)
NonHDL: 99.14
Total CHOL/HDL Ratio: 2
Triglycerides: 53 mg/dL (ref 0.0–149.0)
VLDL: 10.6 mg/dL (ref 0.0–40.0)

## 2017-07-01 LAB — COMPREHENSIVE METABOLIC PANEL
ALBUMIN: 4.4 g/dL (ref 3.5–5.2)
ALK PHOS: 47 U/L (ref 39–117)
ALT: 17 U/L (ref 0–35)
AST: 24 U/L (ref 0–37)
BUN: 15 mg/dL (ref 6–23)
CHLORIDE: 101 meq/L (ref 96–112)
CO2: 30 mEq/L (ref 19–32)
Calcium: 9.6 mg/dL (ref 8.4–10.5)
Creatinine, Ser: 0.85 mg/dL (ref 0.40–1.20)
GFR: 74.18 mL/min (ref >60.00)
Glucose, Bld: 91 mg/dL (ref 70–99)
POTASSIUM: 3.9 meq/L (ref 3.5–5.1)
SODIUM: 140 meq/L (ref 135–145)
TOTAL PROTEIN: 7.4 g/dL (ref 6.0–8.3)
Total Bilirubin: 0.6 mg/dL (ref 0.2–1.2)

## 2017-07-01 LAB — POC URINALSYSI DIPSTICK (AUTOMATED)
BILIRUBIN UA: NEGATIVE
Blood, UA: NEGATIVE
Glucose, UA: NEGATIVE
KETONES UA: NEGATIVE
LEUKOCYTES UA: NEGATIVE
NITRITE UA: NEGATIVE
Protein, UA: NEGATIVE
Spec Grav, UA: 1.01 (ref 1.010–1.025)
Urobilinogen, UA: 0.2 E.U./dL
pH, UA: 6 (ref 5.0–8.0)

## 2017-07-01 LAB — CBC
HEMATOCRIT: 40.4 % (ref 36.0–46.0)
HEMOGLOBIN: 13.7 g/dL (ref 12.0–15.0)
MCHC: 34 g/dL (ref 30.0–36.0)
MCV: 88.3 fl (ref 78.0–100.0)
Platelets: 260 10*3/uL (ref 150.0–400.0)
RBC: 4.57 Mil/uL (ref 3.87–5.11)
RDW: 13.8 % (ref 11.5–15.5)
WBC: 5.9 10*3/uL (ref 4.0–10.5)

## 2017-07-01 LAB — TSH: TSH: 1.3 u[IU]/mL (ref 0.35–4.50)

## 2017-07-01 NOTE — Patient Instructions (Addendum)
Schedule a visit with Dr. Paulla Fore before you leave  Shingrix at future visit  Keep up the goodwork  Please stop by lab before you go

## 2017-07-01 NOTE — Progress Notes (Signed)
Phone: [phone]  Subjective:  Patient presents today for their annual physical. Chief complaint-noted.   See problem oriented charting- ROS- full  review of systems was completed and negative except for: hearing loss, joint pain, lightheadedness at times, seasonal allergies, palpitations  The following were reviewed and entered/updated in epic: Past Medical History:  Diagnosis Date  . 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 Alcoholism in family   . GOITER, MULTINODULAR 01/08/2008  . Grave's disease   . Hyperthyroidism   . HYPOTHYROIDISM, POST-RADIATION 07/19/2008   Patient Active Problem List   Diagnosis Date Noted  . Hypothyroidism after radiation 07/19/2008    Priority: Medium  . GOITER, MULTINODULAR 01/08/2008    Priority: Medium  . Hx of adenomatous colonic polyps 09/29/2014    Priority: Low  . Low back pain 09/29/2014    Priority: Low  . Former smoker 09/29/2014    Priority: Low  . Internal hemorrhoids 10/13/2012    Priority: Low  . ADJUSTMENT DISORDER WITH MIXED FEATURES 10/24/2006    Priority: Low  . Fatigue 09/02/2015   Past Surgical History:  Procedure Laterality Date  . CESAREAN SECTION     x2, 1998, 2000  . FOOT SURGERY     to remove sewing needle, age  . LAPAROSCOPY  1993   Endometriosis  . polyp   2007   removal   . SHOULDER ARTHROSCOPY     left    Family History  Problem Relation Age of Onset  . Alcohol abuse Father   . Arthritis Mother   . Uterine cancer Mother   . Thyroid disease Other        great grandmother-graves  . Breast cancer Maternal Aunt        mets to brain  . Colon cancer Maternal Uncle   . Aneurysm Sister        brain- fully recovered    Medications- reviewed and updated Current Outpatient Medications  Medication Sig Dispense Refill  . Calcium-Vitamin D-Vitamin K (VIACTIV) [id number] MG-UNT-MCG CHEW Chew 1 tablet by mouth daily.      . cetirizine (ZYRTEC) 10 MG tablet Take 10 mg by mouth daily as needed.       . fluticasone (FLONASE) 50 MCG/ACT nasal spray Place into both nostrils daily. Reported on 09/02/2015    . levothyroxine (SYNTHROID, LEVOTHROID) 75 MCG tablet TAKE ONE TABLET BY MOUTH EVERY MORNING BEFORE BREAKFAST 90 tablet 0   No current facility-administered medications for this visit.    Allergies-reviewed and updated No Known Allergies  Social History   Social History Narrative   Married (patient of Dr. [name]). 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 Oropharynx normal Neck: no thyromegaly CV: RRR no murmurs rubs or gallops Lungs: CTAB no crackles, wheeze, rhonchi Abdomen: soft/nontender/nondistended/normal bowel sounds. No rebound or guarding.  Ext: no edema Skin: warm, dry Neuro: grossly normal, moves all extremities, PERRLA  Assessment/Plan:  52 y.o. female presenting for annual physical.  Health Maintenance counseling: 1. Anticipatory guidance: Patient counseled regarding regular dental exams -q6 months, eye exams yearly, wearing seatbelts.  2. Risk factor reduction:  Advised patient of need for regular exercise and diet rich and fruits and vegetables to reduce risk of heart attack and stroke. Has dropped 15 pounds from her peak - she is doing intermittent fasting plus weight watchers- has upped fruits/veggies. Has kept up her exercise- doing 7 days consistently.  Wt Readings from Last 3 Encounters:  07/01/17 145 lb 9.6 oz (66 kg)  04/27/16 155 lb 9.6 oz (70.6 kg)  01/23/16 155 lb 9.6 oz (70.6 kg)  3.  Immunizations/screenings/ancillary studies- discussed shingrix availability issues.  Immunization History  Administered Date(s) Administered  . Influenza Split 05/05/2011, 04/18/2012  . Influenza Whole 04/27/2005, 02/26/2008, 04/22/2009, 05/22/2010  . Influenza,inj,Quad PF,6+ Mos 03/07/2015, 04/12/2017  . Tdap 09/29/2014   Health Maintenance Due  Topic Date Due  . Hepatitis C Screening - discussed and has already had this as [facility] blood [facility] 10-21-1965   4. Cervical cancer screening- 10/19/14 with 3 year repeat needed- does with [facility]  5. Breast cancer screening-  breast exam with GYN and mammogram 07/27/16  6. Colon cancer screening - 10/21/12 with 5 year follow up due to adenoma history- on recall for this year 7. Skin cancer screening- annual screening with Dr. Renda Rolls. advised regular sunscreen use. Denies worrisome, changing, or new skin lesions.  8. Birth control/STD check- postmenopausal/ monogamous declines  9. Osteoporosis screening at 41- will plan on this. Denies family history  10. Former smoker- only 5 pack years quitting 2015 though- will get ua  Status of chronic or acute concerns   History of graves disease found as goiter- had radioactive iodine treatment now hypothyroid- on levothyroxine 75 mcg- update tsh  Adjustment disorder- follows with Dr. Gwenlyn Saran, PsyD- declines any SI   Given hypothyroidism- will update lipids, cbc, cmp Lab Results  Component Value Date   CHOL 170 10/06/2014   HDL 57.20 10/06/2014   LDLCALC 98 10/06/2014   TRIG 75.0 10/06/2014   CHOLHDL 3 10/06/2014   Right hip pain- was found to have OA- had MR arthrogram per notes 02/2017 from Dr. Lynann Bologna. Also has some right knee OA. Patient declined steroid injection- doesn't think pain is that severe but still wants to see if there are options to manage outside of steroids. Was told to take turmeric- taking this with pepper along with it. She wants to see about other options/second opinion-  will refer to Dr. Paulla Fore. Mobic short term did not help.   Some hearing loss- since an infection when younger- even back to her 30s  lightheadedness at times- more post concussion, often during intense workout  palpitations - occasionally while resting may feel like has a missed beat. Decreasing in frequency from a few months ago- hasnt had in last month. No chest pain or shortness of breath.   Return in about 1 year (around 07/01/2018) for physical.  Lab/Order associations: Other specified hypothyroidism - Plan: TSH, CBC, Comprehensive metabolic panel, Lipid panel  Former smoker - Plan: POCT Urinalysis Dipstick (Automated)  Right hip pain - Plan: Ambulatory referral to Sports Medicine  Return precautions advised.  Garret Reddish, MD

## 2017-07-01 NOTE — Addendum Note (Signed)
Addended by: Kayren Eaves T on: 07/01/2017 08:45 AM   Modules accepted: Orders

## 2017-07-02 ENCOUNTER — Encounter: Payer: Self-pay | Admitting: Family Medicine
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
## 2017-07-07 ENCOUNTER — Other Ambulatory Visit: Payer: Self-pay | Admitting: Endocrinology

## 2017-07-09 ENCOUNTER — Ambulatory Visit (INDEPENDENT_AMBULATORY_CARE_PROVIDER_SITE_OTHER): Payer: BLUE CROSS/BLUE SHIELD | Admitting: Sports Medicine

## 2017-07-09 ENCOUNTER — Encounter: Payer: Self-pay | Admitting: Sports Medicine

## 2017-07-09 ENCOUNTER — Ambulatory Visit: Payer: Self-pay

## 2017-07-09 VITALS — BP 122/82 | HR 65 | Ht 64.0 in | Wt 146.2 lb

## 2017-07-09 DIAGNOSIS — M1611 Unilateral primary osteoarthritis, right hip: Secondary | ICD-10-CM | POA: Diagnosis not present

## 2017-07-09 DIAGNOSIS — M25561 Pain in right knee: Secondary | ICD-10-CM | POA: Diagnosis not present

## 2017-07-09 DIAGNOSIS — M25551 Pain in right hip: Secondary | ICD-10-CM | POA: Diagnosis not present

## 2017-07-09 DIAGNOSIS — G8929 Other chronic pain: Secondary | ICD-10-CM

## 2017-07-09 MED ORDER — IBUPROFEN-FAMOTIDINE 800-26.6 MG PO TABS: 1.0000 | ORAL_TABLET | Freq: Three times a day (TID) | ORAL | 2 refills | Status: DC | PRN

## 2017-07-09 MED ORDER — IBUPROFEN-FAMOTIDINE 800-26.6 MG PO TABS: 1.0000 | ORAL_TABLET | Freq: Three times a day (TID) | ORAL | 0 refills | Status: DC | PRN

## 2017-07-09 NOTE — Assessment & Plan Note (Signed)
Symptoms are consistent with mild to moderate osteoarthritis.  2 weeks of Duexis followed by intermittent bursting Core stabilization exercises discussed in detail and emphasized today per procedure note. Avoid significant exacerbating activities Continue with tomorrow Can consider intra-articular injection if any lack of improvement or persistent ongoing symptoms.

## 2017-07-09 NOTE — Procedures (Signed)
PROCEDURE NOTE: THERAPEUTIC EXERCISES (97110) 15 minutes spent for Therapeutic exercises as below and as referenced in the AVS. This included exercises focusing on stretching, strengthening, with significant focus on eccentric aspects.  Proper technique shown and discussed handout in great detail with ATC. All questions were discussed and answered.   Long term goals include an improvement in range of motion, strength, endurance as well as avoiding reinjury. Frequency of visits is one time as determined during today's  office visit. Frequency of exercises to be performed is as per handout.  EXERCISES REVIEWED:  Hip abduction strengthening  Goodman exercises  VMO strengthening

## 2017-07-09 NOTE — Assessment & Plan Note (Signed)
Structurally stable exam

## 2017-07-09 NOTE — Patient Instructions (Signed)
Also check out [employer]" which is a program developed by Dr. Minerva Ends.   There are links to a couple of his YouTube Videos below and I would like to see performing one of his videos 5-6 days per week.    A good intro video is: "Independence from Pain 7-minute Video" - [email or web address]   His more advanced video is: "Powerful Posture and Pain Relief: 12 minutes of Foundation Training" - https://youtu.[email or web address]  Do not try to attempt this entire video when first beginning.    Try breaking of each exercise that he goes into shorter segments.  Otherwise if they perform an exercise for 45 seconds, start with 15 seconds and rest and then resume when they begin the new activity.    If you work your way up to doing this 12 minute video, I expect you will see significant improvements in your pain.  If you enjoy his videos and would like to find out more you can look on his website: [email or web address]  He has a workout streaming option as well as a DVD set available for purchase.  Amazon has the best price for his DVDs.      Please perform the exercise program that we have prepared for you and gone over in detail on a daily basis.  In addition to the handout you were provided you can access your program through: [email or web address]   Your unique program code is: Allison Campos instructions for Duexis, Pennsaid and Vimovo:  Your prescription will be filled through a mail order pharmacy.  It is typically [place] but may vary depending on where you live.  You will receive a phone call from them which will typically come from a [phone] phone number.  You must speak directly to them to have this medication filled.  When the pharmacy calls, they will need your mailing address (for overnight shipment of the medication) andy they will need payment information if you have a copay (typically no more than $10). If you have not heard from them  2-3 days after your appointment with Dr. Paulla Fore, contact us at the office [phone]) or through [facility] so we can reach back out to the pharmacy.

## 2017-07-09 NOTE — Progress Notes (Signed)
Allison Campos. Allison Campos, [place] at [place]  Allison Campos - 52 y.o. female MRN [id number]  Date of birth: 1965-06-14  Visit Date: 07/09/2017  PCP: Marin Olp, MD   Referred by: Marin Olp, MD   Scribe for today's visit: Josepha Pigg, CMA     SUBJECTIVE:  Allison Campos is here for New Patient (Initial Visit) (RT hip and RT knee pain) .  Referred by: Dr. Garret Reddish Her RT hip and RT knee pain symptoms INITIALLY: Began a couple of years ago but has recently gotten worse.  MOI is unknown. She does work out a lot but there wasn't one specific incident that triggered the pain.  Described as moderate burning, sharp pain.  nonradiating.  Worsened with lunges and squats (knee). Pain worse in hip with prolonged periods of sitting.  Improved with rest. She has also benefiting from seeing a massage therapist in the past, relief with gluteal massage.  Additional associated symptoms include: She has noticed clicking in the hip while walking. No swelling.     At this time symptoms are worsening compared to onset  She has been taking Mobic with some relief, clicking resolved while on Mobic.[name] She has also been taking Turmeric.   MR arthrogram done 02/2017 showed OA.    ROS Denies night time disturbances. Denies fevers, chills, or night sweats. Denies unexplained weight loss. Denies personal history of cancer. Denies changes in bowel or bladder habits. Denies recent unreported falls. Denies new or worsening dyspnea or wheezing. Reports dizziness during spin class this morning.  Denies numbness, tingling or weakness  In the extremities.  Denies dizziness or presyncopal episodes Denies lower extremity edema     HISTORY & PERTINENT PRIOR DATA:  Prior History reviewed and updated per electronic medical record.  Significant history, findings, studies and interim changes include:  reports that she quit smoking about 3  years ago. Her smoking use included cigarettes. She has a 5.00 pack-year smoking history. she has never used smokeless tobacco. No results for input(s): HGBA1C, LABURIC, CREATINE in the last 8760 hours. No specialty comments available. Problem  Primary Osteoarthritis of Right Hip   MRI arthrogram at [facility] on 03/27/2017 that showed mild degenerative changes with intact labrum.   Right Medial Knee Pain  Arthritis    OBJECTIVE:  VS:  HT:5\' 4"  (162.6 cm)   WT:146 lb 3.2 oz (66.3 kg)  BMI:25.08    BP:122/82  HR:65bpm  TEMP: ( )  RESP:96 %   PHYSICAL EXAM: Constitutional: WDWN, Non-toxic appearing. Psychiatric: Alert & appropriately interactive.  Not depressed or anxious appearing. Respiratory: No increased work of breathing.  Trachea Midline Eyes: Pupils are equal.  EOM intact without nystagmus.  No scleral icterus  NEUROVASCULAR exam: No clubbing or cyanosis appreciated No significant venous stasis changes Capillary Refill: normal, less than 2 seconds   Right hip: Overall well aligned.  She has limited external and internal rotation by 10-15 degrees compared to the left.  Pain with Bosie Clos test is mild.  She has markedly weak hip abduction on the right compared to the left and generalized tenderness over the gluteal tendons.  Right knee overall well aligned without deformity  no effusion No synovitis Ligamentously stable.  No pain with McMurray's.  She has a normal and symmetric 1 legged squat. No additional findings.   ASSESSMENT & PLAN:   1. Right hip pain   2. Chronic pain of right knee   3.  Primary osteoarthritis of right hip   4. Right medial knee pain    PLAN:    Primary osteoarthritis of right hip Symptoms are consistent with mild to moderate osteoarthritis.  2 weeks of Duexis followed by intermittent bursting Core stabilization exercises discussed in detail and emphasized today per procedure note. Avoid significant exacerbating activities Continue  with tomorrow Can consider intra-articular injection if any lack of improvement or persistent ongoing symptoms.  Right medial knee pain Structurally stable exam   ++++++++++++++++++++++++++++++++++++++++++++ Orders & Meds: Orders Placed This Encounter  Procedures  . [place] MSK POCT ULTRASOUND    Meds ordered this encounter  Medications  . Ibuprofen-Famotidine (DUEXIS) 800-26.6 MG TABS    Sig: Take 1 tablet by mouth 3 (three) times daily as needed. 1 tab po tid X 14 days then 1 tab po tid as needed    Dispense:  90 tablet    Refill:  2    Home Phone      [phone] Mobile          [phone]   . Ibuprofen-Famotidine (DUEXIS) 800-26.6 MG TABS    Sig: Take 1 tablet by mouth 3 (three) times daily as needed.    Dispense:  9 tablet    Refill:  0    ++++++++++++++++++++++++++++++++++++++++++++ Follow-up: Return in about 8 weeks (around 09/03/2017).   Pertinent documentation may be included in additional procedure notes, imaging studies, problem based documentation and patient instructions. Please see these sections of the encounter for additional information regarding this visit. CMA/ATC served as Education administrator during this visit. History, Physical, and Plan performed by medical provider. Documentation and orders reviewed and attested to.      Gerda Diss, [facility] Physician

## 2017-08-02 ENCOUNTER — Other Ambulatory Visit: Payer: Self-pay | Admitting: Obstetrics and Gynecology

## 2017-08-02 DIAGNOSIS — Z1231 Encounter for screening mammogram for malignant neoplasm of breast: Secondary | ICD-10-CM

## 2017-08-26 ENCOUNTER — Ambulatory Visit (INDEPENDENT_AMBULATORY_CARE_PROVIDER_SITE_OTHER): Payer: BLUE CROSS/BLUE SHIELD | Admitting: Psychology

## 2017-08-26 ENCOUNTER — Ambulatory Visit
Admission: RE | Admit: 2017-08-26 | Discharge: 2017-08-26 | Disposition: A | Discharge status: Home / Self Care | Payer: BLUE CROSS/BLUE SHIELD | Source: Ambulatory Visit | Attending: Obstetrics and Gynecology | Admitting: Obstetrics and Gynecology

## 2017-08-26 DIAGNOSIS — Z1231 Encounter for screening mammogram for malignant neoplasm of breast: Secondary | ICD-10-CM

## 2017-08-26 DIAGNOSIS — F4323 Adjustment disorder with mixed anxiety and depressed mood: Secondary | ICD-10-CM

## 2017-08-26 NOTE — Assessment & Plan Note (Addendum)
Report of mood is euthymic.  Her self-report measures look really good.  I think she is feeling empowered.  She has been thinking a long time about this situation and her plan to make a change.  She is well prepared so today was more about fine-tuning and support.    Given her decision to leave a relationship, talked about the issue of safety and provided her a website to look at risk factors.  Based on her report, I think she is at a low risk for being harmed.  Reviewed basic recommendations for a safety plan.  Collaborated on follow-up.  She said she would get in touch with me as needed.  I did encourage her to schedule after the fact as she may experience more negative emotion than she anticipates.  She agreed to be mindful.

## 2017-08-26 NOTE — Progress Notes (Signed)
Reason for follow-up:  Patient has decided to enact a major life change - one she has been planning for some time.  She came in to help prepare.  Issues discussed:  As above.  Discussed ideal outcome and worst-case scenario as well as things in between.  Looked specifically at communication and how it can impact her and the situation.

## 2017-09-03 ENCOUNTER — Ambulatory Visit (INDEPENDENT_AMBULATORY_CARE_PROVIDER_SITE_OTHER): Payer: BLUE CROSS/BLUE SHIELD | Admitting: Sports Medicine

## 2017-09-03 ENCOUNTER — Encounter: Payer: Self-pay | Admitting: Sports Medicine

## 2017-09-03 ENCOUNTER — Ambulatory Visit: Payer: Self-pay

## 2017-09-03 VITALS — BP 110/80 | HR 59 | Ht 64.0 in | Wt 147.8 lb

## 2017-09-03 DIAGNOSIS — M25551 Pain in right hip: Secondary | ICD-10-CM

## 2017-09-03 DIAGNOSIS — M1611 Unilateral primary osteoarthritis, right hip: Secondary | ICD-10-CM | POA: Diagnosis not present

## 2017-09-03 NOTE — Progress Notes (Signed)
PROCEDURE NOTE:  Ultrasound Guided: Injection: Right hip Images were obtained and interpreted by myself, Teresa Coombs, DO  Images have been saved and stored to PACS system. Images obtained on: GE S7 Ultrasound machine    ULTRASOUND FINDINGS:  + small effusion  DESCRIPTION OF PROCEDURE:  The patient's clinical condition is marked by substantial pain and/or significant functional disability. Other conservative therapy has not provided relief, is contraindicated, or not appropriate. There is a reasonable likelihood that injection will significantly improve the patient's pain and/or functional impairment.   After discussing the risks, benefits and expected outcomes of the injection and all questions were reviewed and answered, the patient wished to undergo the above named procedure.  Verbal consent was obtained.  The ultrasound was used to identify the target structure and adjacent neurovascular structures. The skin was then prepped in sterile fashion and the target structure was injected under direct visualization using sterile technique as below:  PREP: Alcohol and Ethel Chloride APPROACH: direct, stopcock technique, 22g 3.5 in. INJECTATE: 3 cc 1% lidocaine, 2 cc 0.5% Marcaine and 2 cc 40mg /mL DepoMedrol ASPIRATE: None DRESSING: Band-Aid  Post procedural instructions including recommending icing and warning signs for infection were reviewed.    This procedure was well tolerated and there were no complications.   IMPRESSION: Succesful Ultrasound Guided: Injection

## 2017-09-03 NOTE — Procedures (Signed)
  Gerda Diss, DO  Physician  Family Medicine  Progress Notes  Signed  Encounter Date:  09/03/2017          Signed         [] Hide copied text  [x] Hover for details  PROCEDURE NOTE:  Ultrasound Guided: Injection: Right hip Images were obtained and interpreted by myself, Teresa Coombs, DO  Images have been saved and stored to PACS system. Images obtained on: GE S7 Ultrasound machine  ULTRASOUND FINDINGS:  + small effusion  DESCRIPTION OF PROCEDURE:  The patient's clinical condition is marked by substantial pain and/or significant functional disability. Other conservative therapy has not provided relief, is contraindicated, or not appropriate. There is a reasonable likelihood that injection will significantly improve the patient's pain and/or functional impairment.  After discussing the risks, benefits and expected outcomes of the injection and all questions were reviewed and answered, the patient wished to undergo the above named procedure. Verbal consent was obtained.  The ultrasound was used to identify the target structure and adjacent neurovascular structures. The skin was then prepped in sterile fashion and the target structure was injected under direct visualization using sterile technique as below:  PREP: Alcohol and Ethel Chloride APPROACH: direct, stopcock technique, 22g 3.5 in. INJECTATE: 3 cc 1% lidocaine, 2 cc 0.5% Marcaine and 2 cc 40mg /mL DepoMedrol ASPIRATE: None DRESSING: Band-Aid  Post procedural instructions including recommending icing and warning signs for infection were reviewed.   This procedure was well tolerated and there were no complications.   IMPRESSION: Succesful Ultrasound Guided: Injection

## 2017-09-03 NOTE — Progress Notes (Signed)
Allison Campos. Allison Campos, [place] at [place]  Allison Campos - 52 y.o. female MRN [id number]  Date of birth: [birth year]  Visit Date: 09/03/2017  PCP: Marin Olp, MD   Referred by: Marin Olp, MD  Scribe for today's visit: Josepha Pigg, CMA     SUBJECTIVE:  Allison Campos is here for Follow-up (R hip pain) taking Diexis w Her RT hip and RT knee pain symptoms INITIALLY: Began a couple of years ago but has recently gotten worse.  MOI is unknown. She does work out a lot but there wasn't one specific incident that triggered the pain.  Described as moderate burning, sharp pain.  nonradiating.  Worsened with lunges and squats (knee). Pain worse in hip with prolonged periods of sitting.  Improved with rest. She has also benefiting from seeing a massage therapist in the past, relief with gluteal massage.  Additional associated symptoms include: She has noticed clicking in the hip while walking. No swelling.    At this time symptoms are worsening compared to onset  She has been taking Mobic with some relief, clicking resolved while on Mobic.[name] She has also been taking Turmeric.  MR arthrogram done 02/2017 showed OA.   09/03/2017: Compared to the last office visit, her previously described symptoms are improving.  Current symptoms are mild & are radiating to R knee. She continues to have occasional clicking in the hip. She denies swelling around the knee.  She has been taking Duexis with significant relief. When she stopped taking it the pain came back. She has been doing HEP except when she went on vacation. She is working out 7 days a week.   ROS Denies night time disturbances. Denies fevers, chills, or night sweats. Denies unexplained weight loss. Denies personal history of cancer. Denies changes in bowel or bladder habits. Denies recent unreported falls. Denies new or worsening dyspnea or wheezing. Denies headaches or  dizziness.  Denies numbness, tingling or weakness  In the extremities.  Denies dizziness or presyncopal episodes Reports lower extremity edema    HISTORY & PERTINENT PRIOR DATA:  Prior History reviewed and updated per electronic medical record.  Significant/pertinent history, findings, studies include:  reports that she quit smoking about 3 years ago. Her smoking use included cigarettes. She has a 5.00 pack-year smoking history. She has never used smokeless tobacco. No results for input(s): HGBA1C, LABURIC, CREATINE in the last 8760 hours. No specialty comments available. No problems updated.  OBJECTIVE:  VS:  HT:5\' 4"  (162.6 cm)   WT:147 lb 12.8 oz (67 kg)  BMI:25.36    BP:110/80  HR:(Abnormal) 59bpm  TEMP: ( )  RESP:95 %   PHYSICAL EXAM: Constitutional: WDWN, Non-toxic appearing. Psychiatric: Alert & appropriately interactive.  Not depressed or anxious appearing. Respiratory: No increased work of breathing.  Trachea Midline Eyes: Pupils are equal.  EOM intact without nystagmus.  No scleral icterus  Vascular Exam: warm to touch no edema  lower extremity neuro exam: unremarkable normal strength normal sensation  MSK Exam: + log roll Pain with stinchfield testing Positive  FADIR  ASSESSMENT & PLAN:   1. Right hip pain   2. Primary osteoarthritis of right hip     PLAN:  Intra-articular injection today   Follow-up: Return in about 6 weeks (around 10/15/2017).      Please see additional documentation for Objective, Assessment and Plan sections. Pertinent additional documentation may be included in corresponding procedure notes, imaging studies, problem based documentation  and patient instructions. Please see these sections of the encounter for additional information regarding this visit.  CMA/ATC served as Education administrator during this visit. History, Physical, and Plan performed by medical provider. Documentation and orders reviewed and attested to.      Gerda Diss,  [facility] Physician

## 2017-09-03 NOTE — Patient Instructions (Signed)

## 2017-09-17 ENCOUNTER — Encounter: Payer: Self-pay | Admitting: Gastroenterology

## 2017-09-24 ENCOUNTER — Encounter: Payer: Self-pay | Admitting: Gastroenterology

## 2017-10-11 ENCOUNTER — Ambulatory Visit (AMBULATORY_SURGERY_CENTER): Payer: Self-pay

## 2017-10-11 VITALS — Ht 64.0 in | Wt 148.4 lb

## 2017-10-11 DIAGNOSIS — Z86010 Personal history of colonic polyps: Secondary | ICD-10-CM

## 2017-10-11 MED ORDER — NA SULFATE-K SULFATE-MG SULF 17.5-3.13-1.6 GM/177ML PO SOLN: 1.0000 | Freq: Once | ORAL | 0 refills | Status: AC

## 2017-10-11 NOTE — Progress Notes (Signed)
Per pt, no allergies to soy or egg products.Pt not taking any weight loss meds or using  O2 at home.  Pt refused emmi video. 

## 2017-10-12 ENCOUNTER — Other Ambulatory Visit: Payer: Self-pay | Admitting: Endocrinology

## 2017-10-14 ENCOUNTER — Encounter: Payer: Self-pay | Admitting: Gastroenterology

## 2017-10-14 ENCOUNTER — Other Ambulatory Visit: Payer: Self-pay | Admitting: Endocrinology

## 2017-10-15 ENCOUNTER — Encounter: Payer: Self-pay | Admitting: Family Medicine

## 2017-10-15 ENCOUNTER — Other Ambulatory Visit: Payer: Self-pay

## 2017-10-15 ENCOUNTER — Ambulatory Visit (INDEPENDENT_AMBULATORY_CARE_PROVIDER_SITE_OTHER): Payer: BLUE CROSS/BLUE SHIELD | Admitting: Sports Medicine

## 2017-10-15 ENCOUNTER — Encounter: Payer: Self-pay | Admitting: Sports Medicine

## 2017-10-15 VITALS — BP 122/84 | HR 68 | Ht 64.0 in | Wt 148.6 lb

## 2017-10-15 DIAGNOSIS — M25561 Pain in right knee: Secondary | ICD-10-CM | POA: Diagnosis not present

## 2017-10-15 DIAGNOSIS — M1611 Unilateral primary osteoarthritis, right hip: Secondary | ICD-10-CM

## 2017-10-15 DIAGNOSIS — M25551 Pain in right hip: Secondary | ICD-10-CM

## 2017-10-15 MED ORDER — LEVOTHYROXINE SODIUM 75 MCG PO TABS: 75.0000 ug | ORAL_TABLET | Freq: Every day | ORAL | 0 refills | Status: DC

## 2017-10-15 NOTE — Progress Notes (Signed)
Allison Campos. [place], [place] at [place]  Allison Campos - 52 y.o. female MRN [id number]  Date of birth: 03/14/1966  Visit Date: 10/15/2017  PCP: Marin Olp, MD   Referred by: Marin Olp, MD  Scribe for today's visit: Josepha Pigg, CMA     SUBJECTIVE:  Allison Campos is here for Follow-up (R hip pain)  Her RT hip and RT knee pain symptoms INITIALLY: Began a couple of years ago but has recently gotten worse.  MOI is unknown. She does work out a lot but there wasn't one specific incident that triggered the pain.  Described as moderate burning, sharp pain.  nonradiating.  Worsened with lunges and squats (knee). Pain worse in hip with prolonged periods of sitting.  Improved with rest. She has also benefiting from seeing a massage therapist in the past, relief with gluteal massage.  Additional associated symptoms include: She has noticed clicking in the hip while walking. No swelling.    At this time symptoms are worsening compared to onset  She has been taking Mobic with some relief, clicking resolved while on Mobic.[name] She has also been taking Turmeric.  MR arthrogram done 02/2017 showed OA.   09/03/2017: Compared to the last office visit, her previously described symptoms are improving.  Current symptoms are mild & are radiating to R knee. She continues to have occasional clicking in the hip. She denies swelling around the knee.  She has been taking Duexis with significant relief. When she stopped taking it the pain came back. She has been doing HEP except when she went on vacation. She is working out 7 days a week.   10/15/2017: Compared to the last office visit, her previously described symptoms are improving. Current symptoms are mild & are non-radiating. She denies groin pain.  She has not been taking Mobic, Duexis, or Turmeric. She has not been doing HEP or goodman exercises. She had hip injection 09/03/2017 and  responded well. Pain has decreased but she continues to have clicking in the hip.  MR arthrogram 02/2017.   She c/o increased R knee pain. She denies swelling around the knee. Pain is medial. She has tried icing the knee and wears a brace while exercising.   ROS Denies night time disturbances. Denies fevers, chills, or night sweats. Denies unexplained weight loss. Denies personal history of cancer. Denies changes in bowel or bladder habits. Denies recent unreported falls. Denies new or worsening dyspnea or wheezing. Reports headaches.  Denies numbness, tingling or weakness  In the extremities.  Denies dizziness or presyncopal episodes Denies lower extremity edema    HISTORY & PERTINENT PRIOR DATA:  Prior History reviewed and updated per electronic medical record.  Significant/pertinent history, findings, studies include:  reports that she quit smoking about 3 years ago. Her smoking use included cigarettes. She has a 5.00 pack-year smoking history. She has never used smokeless tobacco. No results for input(s): HGBA1C, LABURIC, CREATINE in the last 8760 hours. No specialty comments available. No problems updated.  OBJECTIVE:  VS:  HT:5\' 4"  (162.6 cm)   WT:148 lb 9.6 oz (67.4 kg)  BMI:25.49    BP:122/84  HR:68bpm  TEMP: ( )  RESP:98 %   PHYSICAL EXAM: Constitutional: WDWN, Non-toxic appearing. Psychiatric: Alert & appropriately interactive.  Not depressed or anxious appearing. Respiratory: No increased work of breathing.  Trachea Midline Eyes: Pupils are equal.  EOM intact without nystagmus.  No scleral icterus  Vascular Exam: warm to  touch no edema  lower extremity neuro exam: unremarkable normal strength normal sensation  MSK Exam: Right hip has pain with FADIR, Stinchfield testing logroll.  Her right knee has medial joint line pain is ligamentously stable with full flexion extension.  No effusion.   ASSESSMENT & PLAN:   1. Primary osteoarthritis of right hip     2. Right hip pain   3. Right medial knee pain     PLAN: Overall doing better from her hip standpoint still having some slight mechanical symptoms but significantly improved following injection.  He discussed using intermittent burst dosing of Duexis.  For her right knee she does have some medial wear and suspect underlying degenerative meniscal tear with mild to moderate osteoarthritis.  Will defer any further x-rays at this time.  We will have her try a reaction knee brace from [employer] and continue with home exercise program previously prescribed.  Follow-up as needed for repeat injection when symptomatic enough.  Follow-up: Return for as needed for ongoing issues.      Please see additional documentation for Objective, Assessment and Plan sections. Pertinent additional documentation may be included in corresponding procedure notes, imaging studies, problem based documentation and patient instructions. Please see these sections of the encounter for additional information regarding this visit.  CMA/ATC served as Education administrator during this visit. History, Physical, and Plan performed by medical provider. Documentation and orders reviewed and attested to.      Gerda Diss, [facility] Physician

## 2017-10-23 ENCOUNTER — Telehealth: Payer: Self-pay | Admitting: Endocrinology

## 2017-10-23 DIAGNOSIS — E890 Postprocedural hypothyroidism: Secondary | ICD-10-CM

## 2017-10-23 NOTE — Telephone Encounter (Signed)
Patient has appt with Dr. Loanne Drilling on 11/18/17 and she is requesting to get her Labs done prior to 11/18/17 so that Dr. Loanne Drilling can go over the lab results with patient at her appt. Patient's ph# [phone]

## 2017-10-23 NOTE — Telephone Encounter (Signed)
Ok to order labs early & call patient to get her on lab schedule?

## 2017-10-23 NOTE — Telephone Encounter (Signed)
Ok, I ordered 

## 2017-10-23 NOTE — Telephone Encounter (Signed)
I have made patient lab appointment for the previous Friday 6/21.

## 2017-10-25 ENCOUNTER — Other Ambulatory Visit: Payer: Self-pay

## 2017-10-25 ENCOUNTER — Encounter: Payer: Self-pay | Admitting: Sports Medicine

## 2017-10-25 ENCOUNTER — Ambulatory Visit (AMBULATORY_SURGERY_CENTER): Payer: BLUE CROSS/BLUE SHIELD | Admitting: Gastroenterology

## 2017-10-25 ENCOUNTER — Encounter: Payer: Self-pay | Admitting: Gastroenterology

## 2017-10-25 VITALS — BP 115/65 | HR 60 | Temp 98.6°F | Resp 12 | Ht 64.0 in | Wt 148.0 lb

## 2017-10-25 DIAGNOSIS — K598 Other specified functional intestinal disorders: Secondary | ICD-10-CM | POA: Diagnosis not present

## 2017-10-25 DIAGNOSIS — Z86010 Personal history of colonic polyps: Secondary | ICD-10-CM

## 2017-10-25 DIAGNOSIS — Z1211 Encounter for screening for malignant neoplasm of colon: Secondary | ICD-10-CM | POA: Diagnosis not present

## 2017-10-25 DIAGNOSIS — K639 Disease of intestine, unspecified: Secondary | ICD-10-CM

## 2017-10-25 MED ORDER — SODIUM CHLORIDE 0.9 % IV SOLN: 500.0000 mL | Freq: Once | INTRAVENOUS | Status: DC

## 2017-10-25 NOTE — Progress Notes (Signed)
A and O x3. Report to RN. Tolerated MAC anesthesia well.

## 2017-10-25 NOTE — Op Note (Signed)
[facility] Patient Name: Allison Campos Procedure Date: 10/25/2017 3:05 PM MRN: [id number] Endoscopist: Ladene Artist , MD Age: 52 Referring MD:  Date of Birth: 1966-03-30 Gender: Female Account #: [id number] Procedure:                Colonoscopy Indications:              Surveillance: Personal history of adenomatous                            polyps on last colonoscopy 5 years ago Medicines:                Monitored Anesthesia Care Procedure:                Pre-Anesthesia Assessment:                           - Prior to the procedure, a History and Physical                            was performed, and patient medications and                            allergies were reviewed. The patient's tolerance of                            previous anesthesia was also reviewed. The risks                            and benefits of the procedure and the sedation                            options and risks were discussed with the patient.                            All questions were answered, and informed consent                            was obtained. Prior Anticoagulants: The patient has                            taken no previous anticoagulant or antiplatelet                            agents. ASA Grade Assessment: II - A patient with                            mild systemic disease. After reviewing the risks                            and benefits, the patient was deemed in                            satisfactory condition to undergo the procedure.  After obtaining informed consent, the colonoscope                            was passed under direct vision. Throughout the                            procedure, the patient's blood pressure, pulse, and                            oxygen saturations were monitored continuously. The                            Model PCF-[id number]) scope was introduced                            through the anus and  advanced to the the cecum,                            identified by appendiceal orifice and ileocecal                            valve. The ileocecal valve, appendiceal orifice,                            and rectum were photographed. The quality of the                            bowel preparation was good. The colonoscopy was                            performed without difficulty. The patient tolerated                            the procedure well. Scope In: 3:12:07 PM Scope Out: 3:30:56 PM Scope Withdrawal Time: 0 hours 14 minutes 48 seconds  Total Procedure Duration: 0 hours 18 minutes 49 seconds  Findings:                 The perianal and digital rectal examinations were                            normal.                           A few segmental non-bleeding erosions were found in                            the descending colon. Biopsies were taken with a                            cold forceps for histology.                           Internal hemorrhoids were found during  retroflexion. The hemorrhoids were small and Grade                            I (internal hemorrhoids that do not prolapse).                           The exam was otherwise without abnormality on                            direct and retroflexion views. Complications:            No immediate complications. Estimated blood loss:                            None. Estimated Blood Loss:     Estimated blood loss: none. Impression:               - Small internal hemorrhoids.                           - Non bleeding descending colon erosions. Biopsied.                           - The examination was otherwise normal on direct                            and retroflexion views. Recommendation:           - Repeat colonoscopy in 5 years for surveillance.                           - Patient has a contact number available for                            emergencies. The signs and symptoms of  potential                            delayed complications were discussed with the                            patient. Return to normal activities tomorrow.                            Written discharge instructions were provided to the                            patient.                           - Resume previous diet.                           - Continue present medications.                           - Await pathology results. Ladene Artist, MD 10/25/2017 3:35:46 PM This report has been signed electronically.

## 2017-10-25 NOTE — Progress Notes (Signed)
Called to room to assist during endoscopic procedure.  Patient ID and intended procedure confirmed with present staff. Received instructions for my participation in the procedure from the performing physician.  

## 2017-10-25 NOTE — Progress Notes (Deleted)
Called to room to assist during endoscopic procedure.  Patient ID and intended procedure confirmed with present staff. Received instructions for my participation in the procedure from the performing physician.  

## 2017-10-25 NOTE — Patient Instructions (Signed)
Impression/Recommendations:  Hemorrhoid handout given to patient.  Repeat colonoscopy in 5 years for surveillance.  Resume previous diet. Continue present medications.  YOU HAD AN ENDOSCOPIC PROCEDURE TODAY AT THE [facility]:   Refer to the procedure report that was given to you for any specific questions about what was found during the examination.  If the procedure report does not answer your questions, please call your gastroenterologist to clarify.  If you requested that your care partner not be given the details of your procedure findings, then the procedure report has been included in a sealed envelope for you to review at your convenience later.  YOU SHOULD EXPECT: Some feelings of bloating in the abdomen. Passage of more gas than usual.  Walking can help get rid of the air that was put into your GI tract during the procedure and reduce the bloating. If you had a lower endoscopy (such as a colonoscopy or flexible sigmoidoscopy) you may notice spotting of blood in your stool or on the toilet paper. If you underwent a bowel prep for your procedure, you may not have a normal bowel movement for a few days.  Please Note:  You might notice some irritation and congestion in your nose or some drainage.  This is from the oxygen used during your procedure.  There is no need for concern and it should clear up in a day or so.  SYMPTOMS TO REPORT IMMEDIATELY:   Following lower endoscopy (colonoscopy or flexible sigmoidoscopy):  Excessive amounts of blood in the stool  Significant tenderness or worsening of abdominal pains  Swelling of the abdomen that is new, acute  Fever of 100F or higher For urgent or emergent issues, a gastroenterologist can be reached at any hour by calling [phone].   DIET:  We do recommend a small meal at first, but then you may proceed to your regular diet.  Drink plenty of fluids but you should avoid alcoholic beverages for 24 hours.  ACTIVITY:  You  should plan to take it easy for the rest of today and you should NOT DRIVE or use heavy machinery until tomorrow (because of the sedation medicines used during the test).    FOLLOW UP: Our staff will call the number listed on your records the next business day following your procedure to check on you and address any questions or concerns that you may have regarding the information given to you following your procedure. If we do not reach you, we will leave a message.  However, if you are feeling well and you are not experiencing any problems, there is no need to return our call.  We will assume that you have returned to your regular daily activities without incident.  If any biopsies were taken you will be contacted by phone or by letter within the next 1-3 weeks.  Please call us at [phone] if you have not heard about the biopsies in 3 weeks.    SIGNATURES/CONFIDENTIALITY: You and/or your care partner have signed paperwork which will be entered into your electronic medical record.  These signatures attest to the fact that that the information above on your After Visit Summary has been reviewed and is understood.  Full responsibility of the confidentiality of this discharge information lies with you and/or your care-partner. 

## 2017-10-28 ENCOUNTER — Telehealth: Payer: Self-pay | Admitting: *Deleted

## 2017-10-28 DIAGNOSIS — L821 Other seborrheic keratosis: Secondary | ICD-10-CM | POA: Diagnosis not present

## 2017-10-28 DIAGNOSIS — D1800 Hemangioma unspecified site: Secondary | ICD-10-CM | POA: Diagnosis not present

## 2017-10-28 DIAGNOSIS — L814 Other melanin hyperpigmentation: Secondary | ICD-10-CM | POA: Diagnosis not present

## 2017-10-28 DIAGNOSIS — D225 Melanocytic nevi of trunk: Secondary | ICD-10-CM | POA: Diagnosis not present

## 2017-10-28 NOTE — Telephone Encounter (Signed)
No answer for post procedure call back. Left message and will attempt to call back later this afternoon. Sm 

## 2017-10-28 NOTE — Telephone Encounter (Signed)
No answer for second post procedure call back. Left message for patient to call with questions or concerns. SM 

## 2017-10-31 ENCOUNTER — Encounter: Payer: Self-pay | Admitting: Gastroenterology

## 2017-11-15 ENCOUNTER — Other Ambulatory Visit (INDEPENDENT_AMBULATORY_CARE_PROVIDER_SITE_OTHER): Payer: BLUE CROSS/BLUE SHIELD

## 2017-11-15 DIAGNOSIS — E890 Postprocedural hypothyroidism: Secondary | ICD-10-CM | POA: Diagnosis not present

## 2017-11-15 LAB — TSH: TSH: 2.29 u[IU]/mL (ref 0.35–4.50)

## 2017-11-15 LAB — T4, FREE: FREE T4: 0.99 ng/dL (ref 0.60–1.60)

## 2017-11-18 ENCOUNTER — Encounter: Payer: Self-pay | Admitting: Endocrinology

## 2017-11-18 ENCOUNTER — Ambulatory Visit (INDEPENDENT_AMBULATORY_CARE_PROVIDER_SITE_OTHER): Payer: BLUE CROSS/BLUE SHIELD | Admitting: Endocrinology

## 2017-11-18 VITALS — BP 128/72 | HR 65 | Wt 151.0 lb

## 2017-11-18 DIAGNOSIS — E890 Postprocedural hypothyroidism: Secondary | ICD-10-CM | POA: Diagnosis not present

## 2017-11-18 NOTE — Patient Instructions (Signed)
Please continue the same thyroid medication. You should have the blood test and physical examination of the thyroid each year.  You can redo the ultrasound as needed.   Please come back here as needed.
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
## 2017-11-26 ENCOUNTER — Other Ambulatory Visit: Payer: Self-pay | Admitting: Endocrinology

## 2017-12-03 DIAGNOSIS — Z01419 Encounter for gynecological examination (general) (routine) without abnormal findings: Secondary | ICD-10-CM | POA: Diagnosis not present

## 2017-12-03 DIAGNOSIS — Z6826 Body mass index (BMI) 26.0-26.9, adult: Secondary | ICD-10-CM | POA: Diagnosis not present

## 2017-12-03 DIAGNOSIS — Z1151 Encounter for screening for human papillomavirus (HPV): Secondary | ICD-10-CM | POA: Diagnosis not present

## 2017-12-07 ENCOUNTER — Encounter: Payer: Self-pay | Admitting: Gastroenterology

## 2017-12-09 MED ORDER — HYDROCORTISONE ACETATE 25 MG RE SUPP: RECTAL | 1 refills | Status: DC

## 2017-12-09 NOTE — Telephone Encounter (Signed)
FW: Non-Urgent Medical Question  Ladene Artist, MD  Sent: Mon December 09, 2017 9:43 AM  To: Marlon Pel, RN      Message   Anusol HC supp PR bid for 2 days then PR qd prn  If not covered use OTC Prep H supp coated with OTC 1% HC cream  If symptoms not improved in 1 week call us

## 2018-01-15 ENCOUNTER — Encounter: Payer: Self-pay | Admitting: *Deleted

## 2018-01-15 ENCOUNTER — Telehealth: Payer: Self-pay | Admitting: *Deleted

## 2018-01-15 NOTE — Telephone Encounter (Signed)
Copied from [place] [id number]. Topic: Inquiry >> Jan 15, 2018  9:16 AM Oliver Pila B wrote: Reason for CRM: pt called b/c she feels she is getting a sinus infection(headache,chills) pt is flying on Sunday; pt wants to know if OTC is best or if she needs an appt

## 2018-01-15 NOTE — Telephone Encounter (Signed)
My Chart message sent to pt to schedule an appointment.

## 2018-01-16 ENCOUNTER — Encounter: Payer: Self-pay | Admitting: Family Medicine

## 2018-01-16 ENCOUNTER — Ambulatory Visit (INDEPENDENT_AMBULATORY_CARE_PROVIDER_SITE_OTHER): Payer: BLUE CROSS/BLUE SHIELD | Admitting: Family Medicine

## 2018-01-16 VITALS — BP 120/76 | HR 64 | Temp 99.0°F | Ht 64.0 in | Wt 150.5 lb

## 2018-01-16 DIAGNOSIS — R6889 Other general symptoms and signs: Secondary | ICD-10-CM | POA: Diagnosis not present

## 2018-01-16 DIAGNOSIS — J029 Acute pharyngitis, unspecified: Secondary | ICD-10-CM

## 2018-01-16 DIAGNOSIS — J0110 Acute frontal sinusitis, unspecified: Secondary | ICD-10-CM | POA: Diagnosis not present

## 2018-01-16 LAB — CBC WITH DIFFERENTIAL/PLATELET
Basophils Absolute: 0.1 10*3/uL (ref 0.0–0.1)
Basophils Relative: 1.5 % (ref 0.0–3.0)
EOS PCT: 2.5 % (ref 0.0–5.0)
Eosinophils Absolute: 0.2 10*3/uL (ref 0.0–0.7)
HCT: 42.5 % (ref 36.0–46.0)
Hemoglobin: 14.3 g/dL (ref 12.0–15.0)
Lymphocytes Relative: 22 % (ref 12.0–46.0)
Lymphs Abs: 1.6 10*3/uL (ref 0.7–4.0)
MCHC: 33.6 g/dL (ref 30.0–36.0)
MCV: 88.3 fl (ref 78.0–100.0)
MONO ABS: 0.6 10*3/uL (ref 0.1–1.0)
Monocytes Relative: 8 % (ref 3.0–12.0)
Neutro Abs: 4.8 10*3/uL (ref 1.4–7.7)
Neutrophils Relative %: 66 % (ref 43.0–77.0)
Platelets: 263 10*3/uL (ref 150.0–400.0)
RBC: 4.82 Mil/uL (ref 3.87–5.11)
RDW: 13.4 % (ref 11.5–15.5)
WBC: 7.3 10*3/uL (ref 4.0–10.5)

## 2018-01-16 LAB — POCT RAPID STREP A (OFFICE): Rapid Strep A Screen: NEGATIVE

## 2018-01-16 LAB — TSH: TSH: 3.27 u[IU]/mL (ref 0.35–4.50)

## 2018-01-16 MED ORDER — METHYLPREDNISOLONE ACETATE 40 MG/ML IJ SUSP
40.0000 mg | Freq: Once | INTRAMUSCULAR | Status: AC
  Administered 2018-01-16: 40 mg via INTRAMUSCULAR

## 2018-01-16 NOTE — Progress Notes (Signed)
PCP: Marin Olp, MD  Subjective:  Allison Campos is a 52 y.o. year old very pleasant female patient who presents with sinusitis symptoms including nasal congestion, sinus tenderness and headaches  -day of illness:Day 4  Has been feeling chills for about 2 weeks On Monday morning woke up with bad sore throat and in afternoon had headache Tried decongestant for a few days and started to feel feverish and achy- didn't seem to be helping Sore throat was worsening and headache was worse Sore throat wakes her in middle of night No strep exposures that she knows of but was at a public event on Saturday.  Has felt like she has had low grade fever but didn't have thermometer. Chills not getting worse Mild cough.  Headache is in top of her head as well as frontal sinuses.  Blowing out a lot of yellow discharge Leaves Sunday morning for [place] and then on a cruise  ROS-denies fever, SOB, NVD. Has had chills and fatigue.   Pertinent Past Medical History-  Patient Active Problem List   Diagnosis Date Noted  . Hypothyroidism after radiation 07/19/2008    Priority: Medium  . GOITER, MULTINODULAR 01/08/2008    Priority: Medium  . Hx of adenomatous colonic polyps 09/29/2014    Priority: Low  . Low back pain 09/29/2014    Priority: Low  . Former smoker 09/29/2014    Priority: Low  . Internal hemorrhoids 10/13/2012    Priority: Low  . ADJUSTMENT DISORDER WITH MIXED FEATURES 10/24/2006    Priority: Low  . Primary osteoarthritis of right hip 07/09/2017  . Right medial knee pain 07/09/2017  . Arthritis 04/27/2017  . Fatigue 09/02/2015    Medications- reviewed  Current Outpatient Medications  Medication Sig Dispense Refill  . Calcium-Vitamin D-Vitamin K (VIACTIV) [phone] MG-UNT-MCG CHEW Chew 1 tablet by mouth daily.      . cetirizine (ZYRTEC) 10 MG tablet Take 10 mg by mouth daily as needed. Alternates with Zyrtec D prn    . diphenhydramine-acetaminophen (TYLENOL PM) 25-500 MG  TABS tablet Take 1 tablet by mouth at bedtime as needed.    . hydrocortisone (ANUSOL-HC) 25 MG suppository Insert one suppository per rectum twice a day for 2 days then nightly 12 suppository 1  . Ibuprofen-Famotidine (DUEXIS) 800-26.6 MG TABS Take 1 tablet by mouth 3 (three) times daily as needed. 1 tab po tid X 14 days then 1 tab po tid as needed 90 tablet 2  . levothyroxine (SYNTHROID, LEVOTHROID) 75 MCG tablet TAKE ONE TABLET BY MOUTH EVERY MORNING BEFORE BREAKFAST 90 tablet 0  . Melatonin 1 MG CAPS Take by mouth daily as needed.    . TURMERIC PO Take by mouth.     No current facility-administered medications for this visit.     Objective: BP 120/76 (BP Location: Left Arm, Patient Position: Sitting, Cuff Size: Normal)   Pulse 64   Temp 99 F (37.2 C) (Oral)   Ht 5\' 4"  (1.626 m)   Wt 150 lb 8 oz (68.3 kg)   SpO2 99%   BMI 25.83 kg/m  Gen: appears fatigued HEENT: Turbinates erythematous with yellow drainage, TM normal, pharynx mildly erythematous with no tonsilar exudate or edema, right maxillary and bilateral sinus tenderness CV: RRR no murmurs rubs or gallops Lungs: CTAB no crackles, wheeze, rhonchi Ext: no edema Skin: warm, dry, no rash  Assessment/Plan:  Sinsusitis Viral based on <10 days, no double sickening, lack of severity of symptoms in first 3 days.  With that being  said-also has plane flight coming up on Sunday as well as vacation. Would be difficult to treat out of country so we opted to go ahead and have augmentin on hand (sending in today). Also plane flight could be particularly difficult with sinus infection if bacterial so we opted to have her start treatment if not starting to improve by tomorrow.   Treatment: -considered steroid: we opted in- will do depo medrol 40mg  today -other symptomatic care with mucinex  Has been feeling cold all the time for a few weeks- will update cbc and tsh. She is compliant with  levothyroxine 75 mcg  Finally, we reviewed  reasons to return to care including if symptoms worsen or persist or new concerns arise (particularly fever or shortness of breath)  Meds ordered this encounter  Medications  . methylPREDNISolone acetate (DEPO-MEDROL) injection 40 mg   Garret Reddish, MD

## 2018-01-16 NOTE — Patient Instructions (Addendum)
We will get strep test to rule out strep throat- though augmentin would be reasonable for that as well . Our team will let you know result before you leave   Sinsusitis Viral based on <10 days, no double sickening, lack of severity of symptoms in first 3 days.  With that being said-also has plane flight coming up on Sunday as well as vacation. Would be difficult to treat out of country so we opted to go ahead and have augmentin on hand (sending in today). Also plane flight could be particularly difficult with sinus infection if bacterial so we opted to have her start treatment if not starting to improve by tomorrow.   Treatment: -considered steroid: we opted in- will do depo medrol 40mg  today -other symptomatic care with mucinex  Finally, we reviewed reasons to return to care including if symptoms worsen or persist or new concerns arise (particularly fever or shortness of breath)

## 2018-01-17 ENCOUNTER — Encounter: Payer: Self-pay | Admitting: Family Medicine

## 2018-01-17 ENCOUNTER — Telehealth: Payer: Self-pay | Admitting: Family Medicine

## 2018-01-17 MED ORDER — AMOXICILLIN-POT CLAVULANATE 875-125 MG PO TABS: 1.0000 | ORAL_TABLET | Freq: Two times a day (BID) | ORAL | 0 refills | Status: AC

## 2018-01-17 NOTE — Telephone Encounter (Signed)
My Chart message sent to Dr. [name] with the following information form last OV: Sinsusitis Viral based on <10 days, no double sickening, lack of severity of symptoms in first 3 days.  With that being said-also has plane flight coming up on Sunday as well as vacation. Would be difficult to treat out of country so we opted to go ahead and have augmentin on hand (sending in today). Also plane flight could be particularly difficult with sinus infection if bacterial so we opted to have her start treatment if not starting to improve by tomorrow.

## 2018-01-17 NOTE — Telephone Encounter (Signed)
Copied from [place] [id number]. Topic: Quick Communication - Rx Refill/Question >> Jan 17, 2018  3:13 PM Sheppard Coil, [employer] wrote: Medication: Augmentin Pt states she thought a script for Augmentin was going to be sent to her pharmacy but there is nothing there for her  Preferred Pharmacy (with phone number or street name): [facility] [address] [place] - [address] [phone] (Phone) [phone] (Fax)  Agent: Please be advised that RX refills may take up to 3 business days. We ask that you follow-up with your pharmacy.

## 2018-01-17 NOTE — Telephone Encounter (Signed)
See note

## 2018-01-17 NOTE — Telephone Encounter (Signed)
I sent this in- see mychart message to make sure she took this.

## 2018-01-20 NOTE — Telephone Encounter (Signed)
Left message on voicemail to call office. Checking to see pt got medication Augmentin.

## 2018-01-23 NOTE — Telephone Encounter (Signed)
Not able to reach patient but I have called pharmacy and medication was picked up.

## 2018-04-02 ENCOUNTER — Ambulatory Visit (INDEPENDENT_AMBULATORY_CARE_PROVIDER_SITE_OTHER): Payer: BLUE CROSS/BLUE SHIELD

## 2018-04-02 ENCOUNTER — Encounter: Payer: Self-pay | Admitting: Family Medicine

## 2018-04-02 DIAGNOSIS — Z23 Encounter for immunization: Secondary | ICD-10-CM

## 2018-05-02 DIAGNOSIS — F4323 Adjustment disorder with mixed anxiety and depressed mood: Secondary | ICD-10-CM | POA: Diagnosis not present

## 2018-06-09 DIAGNOSIS — H16223 Keratoconjunctivitis sicca, not specified as Sjogren's, bilateral: Secondary | ICD-10-CM | POA: Diagnosis not present

## 2018-06-15 DIAGNOSIS — S63632A Sprain of interphalangeal joint of right middle finger, initial encounter: Secondary | ICD-10-CM | POA: Diagnosis not present

## 2018-06-19 ENCOUNTER — Other Ambulatory Visit: Payer: Self-pay | Admitting: Endocrinology

## 2018-07-11 ENCOUNTER — Encounter: Payer: Self-pay | Admitting: Family Medicine

## 2018-07-11 ENCOUNTER — Ambulatory Visit (INDEPENDENT_AMBULATORY_CARE_PROVIDER_SITE_OTHER): Payer: BLUE CROSS/BLUE SHIELD | Admitting: Family Medicine

## 2018-07-11 ENCOUNTER — Ambulatory Visit: Payer: Self-pay

## 2018-07-11 VITALS — BP 110/68 | HR 72 | Temp 98.2°F | Ht 64.0 in | Wt 157.2 lb

## 2018-07-11 DIAGNOSIS — J301 Allergic rhinitis due to pollen: Secondary | ICD-10-CM

## 2018-07-11 DIAGNOSIS — R51 Headache: Secondary | ICD-10-CM | POA: Diagnosis not present

## 2018-07-11 DIAGNOSIS — R519 Headache, unspecified: Secondary | ICD-10-CM

## 2018-07-11 DIAGNOSIS — B9689 Other specified bacterial agents as the cause of diseases classified elsewhere: Secondary | ICD-10-CM

## 2018-07-11 DIAGNOSIS — J329 Chronic sinusitis, unspecified: Secondary | ICD-10-CM

## 2018-07-11 MED ORDER — AZITHROMYCIN 250 MG PO TABS: ORAL_TABLET | ORAL | 0 refills | Status: DC

## 2018-07-11 NOTE — Telephone Encounter (Signed)
Attempted to contact pt; left message on voicemail [phone].

## 2018-07-11 NOTE — Telephone Encounter (Signed)
See note

## 2018-07-11 NOTE — Patient Instructions (Addendum)
Health Maintenance Due  Topic Date Due  . PAP SMEAR-Stop by the front desk to sign a release of information  10/18/2017   Possible sinus infection as cause of symptoms- particularly with your maxillary sinus tenderness on exam today. Since it has been over 10 days- higher likelihood this is bacterial so we opted to treat with antibiotic azithromycin.   If no better in 5 days- we can add prednisone. If no better with either of these two- we need to chat about possible trial Augmentin vs. Imaging. You did have a reassuring neuro exam today and since the sinus exam was more significant- I think its worth a trial of treatment first

## 2018-07-11 NOTE — Progress Notes (Signed)
Phone [phone]   Subjective:  Allison Campos is a 53 y.o. year old very pleasant female patient who presents for/with See problem oriented charting ROS-  No facial or extremity weakness. No slurred words or trouble swallowing. no blurry vision or double vision. No paresthesias. No confusion or word finding difficulties. No recent trauma to head.  No fever chills reported.  Does have headaches as below  Past Medical History-  Patient Active Problem List   Diagnosis Date Noted  . Hypothyroidism after radiation 07/19/2008    Priority: Medium  . GOITER, MULTINODULAR 01/08/2008    Priority: Medium  . Hx of adenomatous colonic polyps 09/29/2014    Priority: Low  . Low back pain 09/29/2014    Priority: Low  . Former smoker 09/29/2014    Priority: Low  . Internal hemorrhoids 10/13/2012    Priority: Low  . ADJUSTMENT DISORDER WITH MIXED FEATURES 10/24/2006    Priority: Low  . Primary osteoarthritis of right hip 07/09/2017  . Right medial knee pain 07/09/2017  . Arthritis 04/27/2017  . Fatigue 09/02/2015    Medications- reviewed and updated Current Outpatient Medications  Medication Sig Dispense Refill  . Calcium-Vitamin D-Vitamin K (VIACTIV) 782-956-21 MG-UNT-MCG CHEW Chew 1 tablet by mouth daily.      . cetirizine (ZYRTEC) 10 MG tablet Take 10 mg by mouth daily as needed. Alternates with Zyrtec D prn    . diphenhydramine-acetaminophen (TYLENOL PM) 25-500 MG TABS tablet Take 1 tablet by mouth at bedtime as needed.    . hydrocortisone (ANUSOL-HC) 25 MG suppository Insert one suppository per rectum twice a day for 2 days then nightly 12 suppository 1  . Ibuprofen-Famotidine (DUEXIS) 800-26.6 MG TABS Take 1 tablet by mouth 3 (three) times daily as needed. 1 tab po tid X 14 days then 1 tab po tid as needed 90 tablet 2  . levothyroxine (SYNTHROID, LEVOTHROID) 75 MCG tablet TAKE ONE TABLET BY MOUTH EVERY MORNING BEFORE BREAKFAST 90 tablet 0  . Melatonin 1 MG CAPS Take by mouth daily as  needed.    . TURMERIC PO Take by mouth.    [name] azithromycin (ZITHROMAX) 250 MG tablet Take 2 tabs on day 1, then 1 tab daily until finished 6 tablet 0  . XIIDRA 5 % SOLN      No current facility-administered medications for this visit.      Objective:  BP 110/68 (BP Location: Left Arm, Patient Position: Sitting, Cuff Size: Large)   Pulse 72   Temp 98.2 F (36.8 C) (Oral)   Ht 5\' 4"  (1.626 m)   Wt 157 lb 3.2 oz (71.3 kg)   SpO2 97%   BMI 26.98 kg/m  Gen: NAD, resting comfortably Some erythema of nasal turbinates but minimal clear discharge Mild frontal sinus tenderness.  Moderate to severe maxillary sinus tenderness.  Tympanic membranes normal CV: RRR no murmurs rubs or gallops Lungs: CTAB no crackles, wheeze, rhonchi Abdomen: soft/nontender/nondistended/normal bowel sounds. No rebound or guarding.  Ext: no edema Skin: warm, dry Neuro: CN II-XII intact, sensation and reflexes normal throughout, 5/5 muscle strength in bilateral upper and lower extremities. Normal finger to nose. Normal rapid alternating movements. No pronator drift. Normal romberg. Normal gait.     Assessment and Plan   Bacterial sinusitis  Sinus headache  S: Patient dealing with headaches for about 2 weeks.  Takes a zyrtec D in the morning and symptoms seem to improve during the day but by evening time symptoms worsen again and eventually ends up taking another  decongestant the following morning (tries to avoid taking anything at night).   Does have history of allergies and similar headaches but usually would be able to knock a pattern like this out within 2-3 days.   3-4/10 pain this morning- has not taken decongestant yet. Headaches are usually frontal- left or right and also notes in top of head. Feels like squeezing sensation.   She has higher anxiety about headaches due to sister with cerebral aneurysm  Other notes- Feels like has a dry spot in her throat and some sneezing.  No vision change.  A/P:  53 year old woman with history of allergies and intermittent sinus headaches that usually respond to 2 or 3 days of decongestants-now with over 2 weeks of recurrent headaches and sinus pressure.  She has rather significant right maxillary sinus tenderness on exam-concerning for bacterial sinusitis-for this reason we opted to trial antibiotic treatment.  She has concerned about potential GI side effects on Augmentin.  We will use that as backup though.  We will trial azithromycin to start and add prednisone next week if no significant improvement.  Reassuring neurological exam today.  Failure to improve on above 2 treatments-could consider CT of the sinuses-if that clear consider neuroimaging.  I strongly suspect she will improve with above treatments -discussed mucinex - D in case need to simply loosen secretions - tylenol for headache ok as well  Meds ordered this encounter  Medications  . azithromycin (ZITHROMAX) 250 MG tablet    Sig: Take 2 tabs on day 1, then 1 tab daily until finished    Dispense:  6 tablet    Refill:  0    Return precautions advised.  Garret Reddish, MD

## 2018-07-11 NOTE — Telephone Encounter (Signed)
Pt. Reports she has had a headache on and off x 2 weeks.Taking Zyrtec D which helps "some but the headache always comes back." Hurts in her forehead and top of head. Reports "a little runny and post nasal drip." No fever. Appointment made for today with her provider. Reason for Disposition . [1] MODERATE headache (e.g., interferes with normal activities) AND [2] present > 24 hours AND [3] unexplained  (Exceptions: analgesics not tried, typical migraine, or headache part of viral illness)  Answer Assessment - Initial Assessment Questions 1. LOCATION: "Where does it hurt?"      Forehead and top of head 2. ONSET: "When did the headache start?" (Minutes, hours or days)      2 WEEKS AGO 3. PATTERN: "Does the pain come and go, or has it been constant since it started?"     The last 5 days has been at night 4. SEVERITY: "How bad is the pain?" and "What does it keep you from doing?"  (e.g., Scale 1-10; mild, moderate, or severe)   - MILD (1-3): doesn't interfere with normal activities    - MODERATE (4-7): interferes with normal activities or awakens from sleep    - SEVERE (8-10): excruciating pain, unable to do any normal activities        Mild 5. RECURRENT SYMPTOM: "Have you ever had headaches before?" If so, ask: "When was the last time?" and "What happened that time?"      Yes 6. CAUSE: "What do you think is causing the headache?"     Allergies 7. MIGRAINE: "Have you been diagnosed with migraine headaches?" If so, ask: "Is this headache similar?"      No 8. HEAD INJURY: "Has there been any recent injury to the head?"      No 9. OTHER SYMPTOMS: "Do you have any other symptoms?" (fever, stiff neck, eye pain, sore throat, cold symptoms)     A little stuffy nose 10. PREGNANCY: "Is there any chance you are pregnant?" "When was your last menstrual period?"       No  Protocols used: HEADACHE-A-AH

## 2018-07-15 ENCOUNTER — Other Ambulatory Visit: Payer: Self-pay | Admitting: Family Medicine

## 2018-07-15 ENCOUNTER — Other Ambulatory Visit: Payer: Self-pay | Admitting: Emergency Medicine

## 2018-07-15 DIAGNOSIS — Z1231 Encounter for screening mammogram for malignant neoplasm of breast: Secondary | ICD-10-CM

## 2018-09-01 ENCOUNTER — Ambulatory Visit: Payer: BLUE CROSS/BLUE SHIELD

## 2018-09-21 ENCOUNTER — Other Ambulatory Visit: Payer: Self-pay | Admitting: Endocrinology

## 2018-10-03 DIAGNOSIS — H1045 Other chronic allergic conjunctivitis: Secondary | ICD-10-CM | POA: Diagnosis not present

## 2018-10-13 ENCOUNTER — Ambulatory Visit: Payer: BLUE CROSS/BLUE SHIELD

## 2018-10-17 DIAGNOSIS — H1045 Other chronic allergic conjunctivitis: Secondary | ICD-10-CM | POA: Diagnosis not present

## 2018-11-12 ENCOUNTER — Other Ambulatory Visit: Payer: Self-pay | Admitting: Gastroenterology

## 2018-11-26 ENCOUNTER — Ambulatory Visit
Admission: RE | Admit: 2018-11-26 | Discharge: 2018-11-26 | Disposition: A | Discharge status: Home / Self Care | Payer: BC Managed Care – PPO | Source: Ambulatory Visit | Attending: Family Medicine | Admitting: Family Medicine

## 2018-11-26 ENCOUNTER — Other Ambulatory Visit: Payer: Self-pay

## 2018-11-26 DIAGNOSIS — Z1231 Encounter for screening mammogram for malignant neoplasm of breast: Secondary | ICD-10-CM

## 2018-12-20 ENCOUNTER — Encounter: Payer: Self-pay | Admitting: Family Medicine

## 2018-12-24 ENCOUNTER — Encounter: Payer: Self-pay | Admitting: Physician Assistant

## 2018-12-24 ENCOUNTER — Ambulatory Visit (INDEPENDENT_AMBULATORY_CARE_PROVIDER_SITE_OTHER): Payer: BC Managed Care – PPO | Admitting: Physician Assistant

## 2018-12-24 DIAGNOSIS — R195 Other fecal abnormalities: Secondary | ICD-10-CM

## 2018-12-24 NOTE — Progress Notes (Signed)
Virtual Visit via Video   I connected with Allison Campos on 12/24/18 at 11:00 AM EDT by a video enabled telemedicine application and verified that I am speaking with the correct person using two identifiers. Location patient: Home Location provider: [facility], Office Persons participating in the virtual visit: [name], [name] PA-C.  I discussed the limitations of evaluation and management by telemedicine and the availability of in person appointments. The patient expressed understanding and agreed to proceed.  I acted as a Education administrator for [employer], PA-C [employer], LPN  Subjective:   HPI:   Black stools Recently restarted taking Duexis for her hip, previously prescribed by Dr. Paulla Fore. She recently started taking it twice a day for recurrent hip pain. After about a week of taking this, she stopped it. Pt c/o black stools for the past week off and on, bowel movements are looser than normal she is going about 3 times a day. She also started omeprazole after she developed stool changes. Denies nausea or vomiting, dizziness, lightheadedness. Pt does have abdominal pain prior to bowel movement below her umbilicus. Denies fever or chills. Does have history of bleeding hemorrhoids if she goes for a long walk.   Last screening colonoscopy was May 2019 -- findings were segmental non-bleeding erosions in descending colon and grade 1 internal hemorrhoids.  Drank alcohol on Saturday. Denies excessive intake or any worsening symptoms with specific foods.  ROS: See pertinent positives and negatives per HPI.  Patient Active Problem List   Diagnosis Date Noted  . Primary osteoarthritis of right hip 07/09/2017  . Right medial knee pain 07/09/2017  . Arthritis 04/27/2017  . Fatigue 09/02/2015  . Hx of adenomatous colonic polyps 09/29/2014  . Low back pain 09/29/2014  . Former smoker 09/29/2014  . Internal hemorrhoids 10/13/2012  . Hypothyroidism after radiation 07/19/2008   . GOITER, MULTINODULAR 01/08/2008  . ADJUSTMENT DISORDER WITH MIXED FEATURES 10/24/2006    Social History   Tobacco Use  . Smoking status: Former Smoker    Packs/day: 0.25    Years: 20.00    Pack years: 5.00    Types: Cigarettes    Quit date: 11/25/2013    Years since quitting: 5.0  . Smokeless tobacco: Never Used  Substance Use Topics  . Alcohol use: Yes    Alcohol/week: 5.0 standard drinks    Types: 5 Glasses of wine per week    Current Outpatient Medications:  .  Calcium-Vitamin D-Vitamin K (VIACTIV) [phone] MG-UNT-MCG CHEW, Chew 1 tablet by mouth daily.  , Disp: , Rfl:  .  cetirizine (ZYRTEC) 10 MG tablet, Take 10 mg by mouth daily as needed. Alternates with Zyrtec D prn, Disp: , Rfl:  .  diphenhydramine-acetaminophen (TYLENOL PM) 25-500 MG TABS tablet, Take 1 tablet by mouth at bedtime as needed., Disp: , Rfl:  .  hydrocortisone (ANUSOL-HC) 25 MG suppository, Insert one suppository per rectum twice a day for 2 days then nightly, Disp: 12 suppository, Rfl: 1 .  Ibuprofen-Famotidine (DUEXIS) 800-26.6 MG TABS, Take 1 tablet by mouth 3 (three) times daily as needed. 1 tab po tid X 14 days then 1 tab po tid as needed, Disp: 90 tablet, Rfl: 2 .  levothyroxine (SYNTHROID) 75 MCG tablet, TAKE ONE TABLET BY MOUTH EVERY MORNING BEFORE BREAKFAST, Disp: 90 tablet, Rfl: 0 .  Melatonin 1 MG CAPS, Take by mouth daily as needed., Disp: , Rfl:  .  TURMERIC PO, Take by mouth., Disp: , Rfl:   No Known Allergies  Objective:   VITALS: Per patient if applicable, see vitals. GENERAL: Alert, appears well and in no acute distress. HEENT: Atraumatic, conjunctiva clear, no obvious abnormalities on inspection of external nose and ears. NECK: Normal movements of the head and neck. CARDIOPULMONARY: No increased WOB. Speaking in clear sentences. I:E ratio WNL.  MS: Moves all visible extremities without noticeable abnormality. PSYCH: Pleasant and cooperative, well-groomed. Speech normal rate and  rhythm. Affect is appropriate. Insight and judgement are appropriate. Attention is focused, linear, and appropriate.  NEURO: CN grossly intact. Oriented as arrived to appointment on time with no prompting. Moves both UE equally.  SKIN: No obvious lesions, wounds, erythema, or cyanosis noted on face or hands.  Assessment and Plan:   Allison Campos was seen today for melena.  Diagnoses and all orders for this visit:  Dark stools   Symptoms are improving with time. She has already stopped Duexis. Discussed that she possibly has gastritis vs ulcer vs other. She appears comfortable and in no apparent distress on my talk with her. She is agreeable to coming into the office on Friday (in two days) for abdominal and rectal exam, as well as to check labs. Worsening precautions discussed in the meantime.  . Reviewed expectations re: course of current medical issues. . Discussed self-management of symptoms. . Outlined signs and symptoms indicating need for more acute intervention. . Patient verbalized understanding and all questions were answered. [name] Health Maintenance issues including appropriate healthy diet, exercise, and smoking avoidance were discussed with patient. . See orders for this visit as documented in the electronic medical record.  I discussed the assessment and treatment plan with the patient. The patient was provided an opportunity to ask questions and all were answered. The patient agreed with the plan and demonstrated an understanding of the instructions.   The patient was advised to call back or seek an in-person evaluation if the symptoms worsen or if the condition fails to improve as anticipated.   CMA or LPN served as scribe during this visit. History, Physical, and Plan performed by medical provider. The above documentation has been reviewed and is accurate and complete.   [place], [place] 12/24/2018

## 2018-12-26 ENCOUNTER — Other Ambulatory Visit: Payer: Self-pay

## 2018-12-26 ENCOUNTER — Encounter: Payer: Self-pay | Admitting: Physician Assistant

## 2018-12-26 ENCOUNTER — Ambulatory Visit (INDEPENDENT_AMBULATORY_CARE_PROVIDER_SITE_OTHER): Payer: BC Managed Care – PPO | Admitting: Physician Assistant

## 2018-12-26 ENCOUNTER — Telehealth: Payer: Self-pay | Admitting: Endocrinology

## 2018-12-26 VITALS — BP 118/80 | HR 61 | Temp 98.3°F | Ht 64.0 in | Wt 155.4 lb

## 2018-12-26 DIAGNOSIS — R195 Other fecal abnormalities: Secondary | ICD-10-CM

## 2018-12-26 DIAGNOSIS — M1611 Unilateral primary osteoarthritis, right hip: Secondary | ICD-10-CM

## 2018-12-26 DIAGNOSIS — E890 Postprocedural hypothyroidism: Secondary | ICD-10-CM

## 2018-12-26 LAB — CBC
HCT: 40.4 % (ref 36.0–46.0)
Hemoglobin: 13.4 g/dL (ref 12.0–15.0)
MCHC: 33.2 g/dL (ref 30.0–36.0)
MCV: 87.5 fl (ref 78.0–100.0)
Platelets: 237 10*3/uL (ref 150.0–400.0)
RBC: 4.61 Mil/uL (ref 3.87–5.11)
RDW: 13.9 % (ref 11.5–15.5)
WBC: 6 10*3/uL (ref 4.0–10.5)

## 2018-12-26 LAB — COMPREHENSIVE METABOLIC PANEL
ALT: 15 U/L (ref 0–35)
AST: 22 U/L (ref 0–37)
Albumin: 4.5 g/dL (ref 3.5–5.2)
Alkaline Phosphatase: 50 U/L (ref 39–117)
BUN: 14 mg/dL (ref 6–23)
CO2: 29 mEq/L (ref 19–32)
Calcium: 9.4 mg/dL (ref 8.4–10.5)
Chloride: 105 mEq/L (ref 96–112)
Creatinine, Ser: 0.76 mg/dL (ref 0.40–1.20)
GFR: 78.98 mL/min (ref >60.00)
Glucose, Bld: 97 mg/dL (ref 70–99)
Potassium: 3.9 mEq/L (ref 3.5–5.1)
Sodium: 140 mEq/L (ref 135–145)
Total Bilirubin: 0.3 mg/dL (ref 0.2–1.2)
Total Protein: 6.9 g/dL (ref 6.0–8.3)

## 2018-12-26 LAB — H. PYLORI ANTIBODY, IGG: H Pylori IgG: NEGATIVE

## 2018-12-26 LAB — T4, FREE: Free T4: 1.15 ng/dL (ref 0.60–1.60)

## 2018-12-26 LAB — TSH: TSH: 2.81 u[IU]/mL (ref 0.35–4.50)

## 2018-12-26 LAB — LIPASE: Lipase: 20 U/L (ref 11.0–59.0)

## 2018-12-26 MED ORDER — LEVOTHYROXINE SODIUM 75 MCG PO TABS: 75.0000 ug | ORAL_TABLET | Freq: Every day | ORAL | 0 refills | Status: DC

## 2018-12-26 NOTE — Telephone Encounter (Signed)
MEDICATION: levothyroxine (SYNTHROID) 75 MCG tablet  PHARMACY:  [facility]   IS THIS A 90 DAY SUPPLY : unknown  IS PATIENT OUT OF MEDICATION: yes  IF NOT; HOW MUCH IS LEFT:   LAST APPOINTMENT DATE: @4 /26/2020  NEXT APPOINTMENT DATE:@8 /21/2020  DO WE HAVE YOUR PERMISSION TO LEAVE A DETAILED MESSAGE:yes  OTHER COMMENTS:    **Let patient know to contact pharmacy at the end of the day to make sure medication is ready. **  ** Please notify patient to allow 48-72 hours to process**  **Encourage patient to contact the pharmacy for refills or they can request refills through [facility]**

## 2018-12-26 NOTE — Telephone Encounter (Signed)
levothyroxine (SYNTHROID) 75 MCG tablet 90 tablet 0 12/26/2018    Sig - Route: Take 1 tablet (75 mcg total) by mouth daily with breakfast. - Oral   Sent to pharmacy as: levothyroxine (SYNTHROID) 75 MCG tablet   E-Prescribing Status: Receipt confirmed by pharmacy (12/26/2018 2:49 PM EDT)

## 2018-12-26 NOTE — Patient Instructions (Signed)
It was great to see you!  We will be in touch as soon as we get your lab results.  If any worsening symptoms over the weekend, please go to urgent care or ER.  Someone will be in touch with you soon to schedule visit with Dr. Junius Roads, sports medicine doctor.  ake care,  Inda Coke PA-C
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
## 2018-12-31 ENCOUNTER — Ambulatory Visit (INDEPENDENT_AMBULATORY_CARE_PROVIDER_SITE_OTHER): Payer: BC Managed Care – PPO | Admitting: Family Medicine

## 2018-12-31 ENCOUNTER — Encounter: Payer: Self-pay | Admitting: Family Medicine

## 2018-12-31 DIAGNOSIS — G8929 Other chronic pain: Secondary | ICD-10-CM | POA: Diagnosis not present

## 2018-12-31 DIAGNOSIS — M1611 Unilateral primary osteoarthritis, right hip: Secondary | ICD-10-CM

## 2018-12-31 DIAGNOSIS — M25561 Pain in right knee: Secondary | ICD-10-CM

## 2018-12-31 MED ORDER — DICLOFENAC SODIUM 1 % TD GEL: 4.0000 g | Freq: Four times a day (QID) | TRANSDERMAL | 6 refills | Status: AC | PRN

## 2018-12-31 MED ORDER — NABUMETONE 750 MG PO TABS: 750.0000 mg | ORAL_TABLET | Freq: Two times a day (BID) | ORAL | 6 refills | Status: AC | PRN
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
## 2019-01-01 ENCOUNTER — Encounter: Payer: Self-pay | Admitting: Physical Therapy

## 2019-01-01 ENCOUNTER — Other Ambulatory Visit: Payer: Self-pay

## 2019-01-01 ENCOUNTER — Ambulatory Visit: Payer: BC Managed Care – PPO | Attending: Family Medicine | Admitting: Physical Therapy

## 2019-01-01 DIAGNOSIS — M25561 Pain in right knee: Secondary | ICD-10-CM | POA: Insufficient documentation

## 2019-01-01 DIAGNOSIS — R252 Cramp and spasm: Secondary | ICD-10-CM | POA: Insufficient documentation

## 2019-01-01 DIAGNOSIS — G8929 Other chronic pain: Secondary | ICD-10-CM | POA: Diagnosis not present

## 2019-01-01 DIAGNOSIS — M6281 Muscle weakness (generalized): Secondary | ICD-10-CM | POA: Insufficient documentation

## 2019-01-01 DIAGNOSIS — M25551 Pain in right hip: Secondary | ICD-10-CM | POA: Insufficient documentation
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
## 2019-01-05 ENCOUNTER — Telehealth: Payer: Self-pay

## 2019-01-05 NOTE — Telephone Encounter (Signed)
Left this information on the voice mail of [employer].

## 2019-01-05 NOTE — Telephone Encounter (Signed)
[id number];SPJSUN:[id number];Review Type:Prior Auth;Coverage Start Date:12/06/2018;Coverage End Date:01/05/2020;  For Diclofenac sodium 1% gel

## 2019-01-06 ENCOUNTER — Ambulatory Visit: Payer: BC Managed Care – PPO | Admitting: Physical Therapy

## 2019-01-08 ENCOUNTER — Encounter: Payer: Self-pay | Admitting: Physical Therapy

## 2019-01-08 ENCOUNTER — Ambulatory Visit: Payer: BC Managed Care – PPO | Admitting: Physical Therapy

## 2019-01-08 ENCOUNTER — Other Ambulatory Visit: Payer: Self-pay

## 2019-01-08 DIAGNOSIS — M25551 Pain in right hip: Secondary | ICD-10-CM

## 2019-01-08 DIAGNOSIS — R252 Cramp and spasm: Secondary | ICD-10-CM

## 2019-01-08 DIAGNOSIS — M25561 Pain in right knee: Secondary | ICD-10-CM | POA: Diagnosis not present

## 2019-01-08 DIAGNOSIS — M6281 Muscle weakness (generalized): Secondary | ICD-10-CM

## 2019-01-08 DIAGNOSIS — G8929 Other chronic pain: Secondary | ICD-10-CM
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
## 2019-01-13 ENCOUNTER — Encounter: Payer: BC Managed Care – PPO | Admitting: Physical Therapy

## 2019-01-14 ENCOUNTER — Other Ambulatory Visit: Payer: Self-pay

## 2019-01-15 ENCOUNTER — Ambulatory Visit: Payer: BC Managed Care – PPO | Admitting: Physical Therapy

## 2019-01-15 ENCOUNTER — Encounter: Payer: Self-pay | Admitting: Physical Therapy

## 2019-01-15 DIAGNOSIS — M6281 Muscle weakness (generalized): Secondary | ICD-10-CM | POA: Diagnosis not present

## 2019-01-15 DIAGNOSIS — R252 Cramp and spasm: Secondary | ICD-10-CM

## 2019-01-15 DIAGNOSIS — G8929 Other chronic pain: Secondary | ICD-10-CM

## 2019-01-15 DIAGNOSIS — M25551 Pain in right hip: Secondary | ICD-10-CM | POA: Diagnosis not present

## 2019-01-15 DIAGNOSIS — M25561 Pain in right knee: Secondary | ICD-10-CM | POA: Diagnosis not present

## 2019-01-15 NOTE — Patient Instructions (Signed)
Access Code: [email or web address]  URL: [email or web address]  Date: 01/15/2019  Prepared by: Jari Favre   Exercises  Supine Figure 4 Piriformis Stretch - 3 reps - 1 sets - 30 sec hold - 1x daily - 7x weekly  Seated Piriformis Stretch with Trunk Bend - 3 reps - 1 sets - 30 sec hold - 1x daily - 7x weekly  Supine ITB Stretch with Strap - 3 reps - 1 sets - 30 sec hold - 1x daily - 7x weekly  Standing ITB Stretch - 10 reps - 3 sets - 1x daily - 7x weekly  Supine Quadriceps Stretch with Strap on Table - 10 reps - 3 sets - 1x daily - 7x weekly  Prone Quadriceps Stretch with Strap - 3 reps - 1 sets - 30sec hold - 1x daily - 7x weekly  Supine Bridge - 10 reps - 3 sets - 1x daily - 7x weekly  Sidelying Hip Abduction - 10 reps - 3 sets - 1x daily - 7x weekly
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
## 2019-01-16 ENCOUNTER — Encounter: Payer: Self-pay | Admitting: Endocrinology

## 2019-01-16 ENCOUNTER — Other Ambulatory Visit: Payer: Self-pay

## 2019-01-16 ENCOUNTER — Ambulatory Visit (INDEPENDENT_AMBULATORY_CARE_PROVIDER_SITE_OTHER): Payer: BC Managed Care – PPO | Admitting: Endocrinology

## 2019-01-16 VITALS — BP 110/80 | HR 60 | Ht 64.0 in | Wt 156.2 lb

## 2019-01-16 DIAGNOSIS — E890 Postprocedural hypothyroidism: Secondary | ICD-10-CM | POA: Diagnosis not present
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
## 2019-01-16 NOTE — Patient Instructions (Addendum)
Please continue the same thyroid medication.   You should have the thyroid area examined the thyroid area examined each year.  Either Dr [name] or I would be happy to do that.   Your require less than a full daily replacement amount of thyroid hormone.  This means that the thyroid was not completely destroyed by the radioactive iodine.  Therefore you have some risk that the overactivity could come back.  this may take many years to happen.  If this happens, you would first notice that your medication requirement would decrease to none.  Then the overactivity would come back.  We'll follow your blood tests for this.

## 2019-01-20 ENCOUNTER — Other Ambulatory Visit: Payer: Self-pay

## 2019-01-20 ENCOUNTER — Encounter: Payer: Self-pay | Admitting: Physical Therapy

## 2019-01-20 ENCOUNTER — Ambulatory Visit: Payer: BC Managed Care – PPO | Admitting: Physical Therapy

## 2019-01-20 DIAGNOSIS — G8929 Other chronic pain: Secondary | ICD-10-CM | POA: Diagnosis not present

## 2019-01-20 DIAGNOSIS — M25551 Pain in right hip: Secondary | ICD-10-CM | POA: Diagnosis not present

## 2019-01-20 DIAGNOSIS — M6281 Muscle weakness (generalized): Secondary | ICD-10-CM

## 2019-01-20 DIAGNOSIS — R252 Cramp and spasm: Secondary | ICD-10-CM | POA: Diagnosis not present

## 2019-01-20 DIAGNOSIS — M25561 Pain in right knee: Secondary | ICD-10-CM | POA: Diagnosis not present
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
## 2019-01-27 ENCOUNTER — Encounter: Payer: BC Managed Care – PPO | Admitting: Physical Therapy

## 2019-01-29 ENCOUNTER — Other Ambulatory Visit: Payer: Self-pay

## 2019-01-29 ENCOUNTER — Ambulatory Visit: Payer: BC Managed Care – PPO | Attending: Family Medicine | Admitting: Physical Therapy

## 2019-01-29 DIAGNOSIS — M6281 Muscle weakness (generalized): Secondary | ICD-10-CM

## 2019-01-29 DIAGNOSIS — M25551 Pain in right hip: Secondary | ICD-10-CM | POA: Diagnosis not present

## 2019-01-29 DIAGNOSIS — M25561 Pain in right knee: Secondary | ICD-10-CM | POA: Diagnosis not present

## 2019-01-29 DIAGNOSIS — R252 Cramp and spasm: Secondary | ICD-10-CM | POA: Insufficient documentation

## 2019-01-29 DIAGNOSIS — G8929 Other chronic pain: Secondary | ICD-10-CM | POA: Insufficient documentation

## 2019-01-29 NOTE — Patient Instructions (Signed)
Access Code: [email or web address]  URL: [email or web address]  Date: 01/29/2019  Prepared by: Jari Favre   Exercises  Supine Figure 4 Piriformis Stretch - 3 reps - 1 sets - 30 sec hold - 1x daily - 7x weekly  Seated Piriformis Stretch with Trunk Bend - 3 reps - 1 sets - 30 sec hold - 1x daily - 7x weekly  Supine ITB Stretch with Strap - 3 reps - 1 sets - 30 sec hold - 1x daily - 7x weekly  Standing ITB Stretch - 10 reps - 3 sets - 1x daily - 7x weekly  Supine Quadriceps Stretch with Strap on Table - 10 reps - 3 sets - 1x daily - 7x weekly  Prone Quadriceps Stretch with Strap - 3 reps - 1 sets - 30sec hold - 1x daily - 7x weekly  Supine Bridge - 10 reps - 3 sets - 1x daily - 7x weekly  Sidelying Hip Abduction - 10 reps - 3 sets - 1x daily - 7x weekly  Single Leg Balance with Opposite Leg Star Reach - 10 reps - 3 sets - 1x daily - 7x weekly
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
## 2019-02-03 ENCOUNTER — Ambulatory Visit: Payer: BC Managed Care – PPO | Admitting: Physical Therapy

## 2019-02-03 ENCOUNTER — Other Ambulatory Visit: Payer: Self-pay

## 2019-02-03 DIAGNOSIS — M25561 Pain in right knee: Secondary | ICD-10-CM | POA: Diagnosis not present

## 2019-02-03 DIAGNOSIS — M25551 Pain in right hip: Secondary | ICD-10-CM

## 2019-02-03 DIAGNOSIS — G8929 Other chronic pain: Secondary | ICD-10-CM

## 2019-02-03 DIAGNOSIS — M6281 Muscle weakness (generalized): Secondary | ICD-10-CM | POA: Diagnosis not present

## 2019-02-03 DIAGNOSIS — R252 Cramp and spasm: Secondary | ICD-10-CM | POA: Diagnosis not present
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
## 2019-02-04 DIAGNOSIS — Z01419 Encounter for gynecological examination (general) (routine) without abnormal findings: Secondary | ICD-10-CM | POA: Diagnosis not present

## 2019-02-04 DIAGNOSIS — Z6827 Body mass index (BMI) 27.0-27.9, adult: Secondary | ICD-10-CM | POA: Diagnosis not present

## 2019-02-10 ENCOUNTER — Encounter: Payer: BC Managed Care – PPO | Admitting: Physical Therapy

## 2019-02-12 ENCOUNTER — Ambulatory Visit: Payer: BC Managed Care – PPO | Admitting: Physical Therapy

## 2019-02-12 ENCOUNTER — Encounter: Payer: Self-pay | Admitting: Physical Therapy

## 2019-02-12 ENCOUNTER — Other Ambulatory Visit: Payer: Self-pay

## 2019-02-12 DIAGNOSIS — R252 Cramp and spasm: Secondary | ICD-10-CM | POA: Diagnosis not present

## 2019-02-12 DIAGNOSIS — M25551 Pain in right hip: Secondary | ICD-10-CM | POA: Diagnosis not present

## 2019-02-12 DIAGNOSIS — M6281 Muscle weakness (generalized): Secondary | ICD-10-CM | POA: Diagnosis not present

## 2019-02-12 DIAGNOSIS — M25561 Pain in right knee: Secondary | ICD-10-CM | POA: Diagnosis not present

## 2019-02-12 DIAGNOSIS — G8929 Other chronic pain: Secondary | ICD-10-CM

## 2019-02-12 NOTE — Patient Instructions (Signed)
Access Code: [email or web address]  URL: [email or web address]  Date: 02/12/2019  Prepared by: Jari Favre   Exercises  Supine Figure 4 Piriformis Stretch - 3 reps - 1 sets - 30 sec hold - 1x daily - 7x weekly  Seated Piriformis Stretch with Trunk Bend - 3 reps - 1 sets - 30 sec hold - 1x daily - 7x weekly  Supine ITB Stretch with Strap - 3 reps - 1 sets - 30 sec hold - 1x daily - 7x weekly  Standing ITB Stretch - 10 reps - 3 sets - 1x daily - 7x weekly  Supine Quadriceps Stretch with Strap on Table - 10 reps - 3 sets - 1x daily - 7x weekly  Prone Quadriceps Stretch with Strap - 3 reps - 1 sets - 30sec hold - 1x daily - 7x weekly  Supine Bridge - 10 reps - 3 sets - 1x daily - 7x weekly  Sidelying Hip Abduction - 10 reps - 3 sets - 1x daily - 7x weekly  Single Leg Balance with Opposite Leg Star Reach - 10 reps - 3 sets - 1x daily - 7x weekly  Supine March - 10 reps - 3 sets - 1x daily - 7x weekly
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
## 2019-02-17 ENCOUNTER — Other Ambulatory Visit: Payer: Self-pay

## 2019-02-17 ENCOUNTER — Encounter: Payer: Self-pay | Admitting: Physical Therapy

## 2019-02-17 ENCOUNTER — Ambulatory Visit: Payer: BC Managed Care – PPO | Admitting: Physical Therapy

## 2019-02-17 DIAGNOSIS — M6281 Muscle weakness (generalized): Secondary | ICD-10-CM | POA: Diagnosis not present

## 2019-02-17 DIAGNOSIS — M25561 Pain in right knee: Secondary | ICD-10-CM | POA: Diagnosis not present

## 2019-02-17 DIAGNOSIS — M25551 Pain in right hip: Secondary | ICD-10-CM | POA: Diagnosis not present

## 2019-02-17 DIAGNOSIS — R252 Cramp and spasm: Secondary | ICD-10-CM

## 2019-02-17 DIAGNOSIS — G8929 Other chronic pain: Secondary | ICD-10-CM | POA: Diagnosis not present
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
## 2019-02-24 ENCOUNTER — Encounter: Payer: Self-pay | Admitting: Physical Therapy

## 2019-02-24 ENCOUNTER — Ambulatory Visit: Payer: BC Managed Care – PPO | Admitting: Physical Therapy

## 2019-02-24 ENCOUNTER — Other Ambulatory Visit: Payer: Self-pay

## 2019-02-24 DIAGNOSIS — M25551 Pain in right hip: Secondary | ICD-10-CM | POA: Diagnosis not present

## 2019-02-24 DIAGNOSIS — M6281 Muscle weakness (generalized): Secondary | ICD-10-CM

## 2019-02-24 DIAGNOSIS — G8929 Other chronic pain: Secondary | ICD-10-CM

## 2019-02-24 DIAGNOSIS — R252 Cramp and spasm: Secondary | ICD-10-CM | POA: Diagnosis not present

## 2019-02-24 DIAGNOSIS — M25561 Pain in right knee: Secondary | ICD-10-CM | POA: Diagnosis not present
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
## 2019-03-03 ENCOUNTER — Other Ambulatory Visit: Payer: Self-pay

## 2019-03-03 ENCOUNTER — Ambulatory Visit: Payer: BC Managed Care – PPO | Attending: Family Medicine | Admitting: Physical Therapy

## 2019-03-03 ENCOUNTER — Encounter: Payer: Self-pay | Admitting: Physical Therapy

## 2019-03-03 DIAGNOSIS — M25561 Pain in right knee: Secondary | ICD-10-CM | POA: Diagnosis not present

## 2019-03-03 DIAGNOSIS — R252 Cramp and spasm: Secondary | ICD-10-CM | POA: Insufficient documentation

## 2019-03-03 DIAGNOSIS — M6281 Muscle weakness (generalized): Secondary | ICD-10-CM | POA: Diagnosis not present

## 2019-03-03 DIAGNOSIS — G8929 Other chronic pain: Secondary | ICD-10-CM | POA: Insufficient documentation

## 2019-03-03 DIAGNOSIS — M25551 Pain in right hip: Secondary | ICD-10-CM | POA: Diagnosis not present
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
## 2019-03-03 NOTE — Patient Instructions (Signed)

## 2019-03-06 ENCOUNTER — Encounter: Payer: Self-pay | Admitting: Family Medicine

## 2019-03-06 ENCOUNTER — Other Ambulatory Visit: Payer: Self-pay

## 2019-03-06 ENCOUNTER — Ambulatory Visit (INDEPENDENT_AMBULATORY_CARE_PROVIDER_SITE_OTHER): Payer: BC Managed Care – PPO

## 2019-03-06 DIAGNOSIS — Z23 Encounter for immunization: Secondary | ICD-10-CM

## 2019-03-12 ENCOUNTER — Encounter: Payer: Self-pay | Admitting: Physical Therapy

## 2019-03-12 ENCOUNTER — Ambulatory Visit: Payer: BC Managed Care – PPO | Admitting: Physical Therapy

## 2019-03-12 ENCOUNTER — Other Ambulatory Visit: Payer: Self-pay

## 2019-03-12 DIAGNOSIS — M25551 Pain in right hip: Secondary | ICD-10-CM

## 2019-03-12 DIAGNOSIS — R252 Cramp and spasm: Secondary | ICD-10-CM | POA: Diagnosis not present

## 2019-03-12 DIAGNOSIS — M6281 Muscle weakness (generalized): Secondary | ICD-10-CM

## 2019-03-12 DIAGNOSIS — M25561 Pain in right knee: Secondary | ICD-10-CM | POA: Diagnosis not present

## 2019-03-12 DIAGNOSIS — G8929 Other chronic pain: Secondary | ICD-10-CM
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
## 2019-03-19 ENCOUNTER — Ambulatory Visit: Payer: BC Managed Care – PPO | Admitting: Physical Therapy

## 2019-03-19 ENCOUNTER — Encounter: Payer: Self-pay | Admitting: Physical Therapy

## 2019-03-19 ENCOUNTER — Other Ambulatory Visit: Payer: Self-pay

## 2019-03-19 DIAGNOSIS — M25551 Pain in right hip: Secondary | ICD-10-CM

## 2019-03-19 DIAGNOSIS — R252 Cramp and spasm: Secondary | ICD-10-CM | POA: Diagnosis not present

## 2019-03-19 DIAGNOSIS — M6281 Muscle weakness (generalized): Secondary | ICD-10-CM | POA: Diagnosis not present

## 2019-03-19 DIAGNOSIS — M25561 Pain in right knee: Secondary | ICD-10-CM

## 2019-03-19 DIAGNOSIS — G8929 Other chronic pain: Secondary | ICD-10-CM
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
## 2019-03-26 ENCOUNTER — Other Ambulatory Visit: Payer: Self-pay

## 2019-03-26 ENCOUNTER — Encounter: Payer: Self-pay | Admitting: Physical Therapy

## 2019-03-26 ENCOUNTER — Ambulatory Visit: Payer: BC Managed Care – PPO | Admitting: Physical Therapy

## 2019-03-26 DIAGNOSIS — M6281 Muscle weakness (generalized): Secondary | ICD-10-CM | POA: Diagnosis not present

## 2019-03-26 DIAGNOSIS — M25561 Pain in right knee: Secondary | ICD-10-CM | POA: Diagnosis not present

## 2019-03-26 DIAGNOSIS — G8929 Other chronic pain: Secondary | ICD-10-CM

## 2019-03-26 DIAGNOSIS — M25551 Pain in right hip: Secondary | ICD-10-CM | POA: Diagnosis not present

## 2019-03-26 DIAGNOSIS — R252 Cramp and spasm: Secondary | ICD-10-CM | POA: Diagnosis not present
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
## 2019-03-26 NOTE — Patient Instructions (Signed)
Access Code: [email or web address]  URL: [email or web address]  Date: 03/26/2019  Prepared by: Jari Favre   Exercises  Supine Figure 4 Piriformis Stretch - 3 reps - 1 sets - 30 sec hold - 1x daily - 7x weekly  Seated Piriformis Stretch with Trunk Bend - 3 reps - 1 sets - 30 sec hold - 1x daily - 7x weekly  Supine ITB Stretch with Strap - 3 reps - 1 sets - 30 sec hold - 1x daily - 7x weekly  Standing ITB Stretch - 10 reps - 3 sets - 1x daily - 7x weekly  Supine Quadriceps Stretch with Strap on Table - 10 reps - 3 sets - 1x daily - 7x weekly  Prone Quadriceps Stretch with Strap - 3 reps - 1 sets - 30sec hold - 1x daily - 7x weekly  Supine Bridge - 10 reps - 3 sets - 1x daily - 7x weekly  Sidelying Hip Abduction - 10 reps - 3 sets - 1x daily - 7x weekly  Single Leg Balance with Opposite Leg Star Reach - 10 reps - 3 sets - 1x daily - 7x weekly  Supine March - 10 reps - 3 sets - 1x daily - 7x weekly  Clamshell in Abduction - 10 reps - 3 sets - 1x daily - 7x weekly  Standing Isometric Hip Abduction with Ball on Wall - 10 reps - 3 sets - 1x daily - 7x weekly  Sidelying Hip Adduction - 10 reps - 2 sets - 1x daily - 7x weekly  Straight Leg Raise with External Rotation - 10 reps - 3 sets - 1x daily - 7x weekly

## 2019-03-28 ENCOUNTER — Other Ambulatory Visit: Payer: Self-pay | Admitting: Endocrinology

## 2019-03-28 DIAGNOSIS — E890 Postprocedural hypothyroidism: Secondary | ICD-10-CM

## 2019-04-14 DIAGNOSIS — M7918 Myalgia, other site: Secondary | ICD-10-CM | POA: Diagnosis not present

## 2019-04-14 DIAGNOSIS — M1611 Unilateral primary osteoarthritis, right hip: Secondary | ICD-10-CM | POA: Diagnosis not present

## 2019-04-14 DIAGNOSIS — M7061 Trochanteric bursitis, right hip: Secondary | ICD-10-CM | POA: Diagnosis not present

## 2019-04-14 DIAGNOSIS — M25551 Pain in right hip: Secondary | ICD-10-CM | POA: Diagnosis not present

## 2019-04-22 DIAGNOSIS — M7061 Trochanteric bursitis, right hip: Secondary | ICD-10-CM | POA: Diagnosis not present

## 2019-04-22 DIAGNOSIS — M25551 Pain in right hip: Secondary | ICD-10-CM | POA: Diagnosis not present

## 2019-04-22 DIAGNOSIS — M1611 Unilateral primary osteoarthritis, right hip: Secondary | ICD-10-CM | POA: Diagnosis not present

## 2019-04-22 DIAGNOSIS — M7918 Myalgia, other site: Secondary | ICD-10-CM | POA: Diagnosis not present

## 2019-04-29 DIAGNOSIS — M1611 Unilateral primary osteoarthritis, right hip: Secondary | ICD-10-CM | POA: Diagnosis not present

## 2019-04-29 DIAGNOSIS — M7918 Myalgia, other site: Secondary | ICD-10-CM | POA: Diagnosis not present

## 2019-04-29 DIAGNOSIS — M25551 Pain in right hip: Secondary | ICD-10-CM | POA: Diagnosis not present

## 2019-04-29 DIAGNOSIS — M7061 Trochanteric bursitis, right hip: Secondary | ICD-10-CM | POA: Diagnosis not present

## 2019-05-02 DIAGNOSIS — M7061 Trochanteric bursitis, right hip: Secondary | ICD-10-CM | POA: Diagnosis not present

## 2019-05-02 DIAGNOSIS — M7918 Myalgia, other site: Secondary | ICD-10-CM | POA: Diagnosis not present

## 2019-05-02 DIAGNOSIS — M1611 Unilateral primary osteoarthritis, right hip: Secondary | ICD-10-CM | POA: Diagnosis not present

## 2019-05-02 DIAGNOSIS — M25551 Pain in right hip: Secondary | ICD-10-CM | POA: Diagnosis not present

## 2019-05-06 DIAGNOSIS — M1611 Unilateral primary osteoarthritis, right hip: Secondary | ICD-10-CM | POA: Diagnosis not present

## 2019-05-06 DIAGNOSIS — M7061 Trochanteric bursitis, right hip: Secondary | ICD-10-CM | POA: Diagnosis not present

## 2019-05-06 DIAGNOSIS — M25551 Pain in right hip: Secondary | ICD-10-CM | POA: Diagnosis not present

## 2019-05-06 DIAGNOSIS — M7918 Myalgia, other site: Secondary | ICD-10-CM | POA: Diagnosis not present

## 2019-05-08 DIAGNOSIS — M25551 Pain in right hip: Secondary | ICD-10-CM | POA: Diagnosis not present

## 2019-05-08 DIAGNOSIS — M7061 Trochanteric bursitis, right hip: Secondary | ICD-10-CM | POA: Diagnosis not present

## 2019-05-08 DIAGNOSIS — M1611 Unilateral primary osteoarthritis, right hip: Secondary | ICD-10-CM | POA: Diagnosis not present

## 2019-05-08 DIAGNOSIS — M7918 Myalgia, other site: Secondary | ICD-10-CM | POA: Diagnosis not present

## 2019-05-11 DIAGNOSIS — M1611 Unilateral primary osteoarthritis, right hip: Secondary | ICD-10-CM | POA: Diagnosis not present

## 2019-05-11 DIAGNOSIS — M25551 Pain in right hip: Secondary | ICD-10-CM | POA: Diagnosis not present

## 2019-05-11 DIAGNOSIS — M7918 Myalgia, other site: Secondary | ICD-10-CM | POA: Diagnosis not present

## 2019-05-11 DIAGNOSIS — M7061 Trochanteric bursitis, right hip: Secondary | ICD-10-CM | POA: Diagnosis not present

## 2019-05-15 DIAGNOSIS — M7061 Trochanteric bursitis, right hip: Secondary | ICD-10-CM | POA: Diagnosis not present

## 2019-05-15 DIAGNOSIS — M25551 Pain in right hip: Secondary | ICD-10-CM | POA: Diagnosis not present

## 2019-05-15 DIAGNOSIS — M7918 Myalgia, other site: Secondary | ICD-10-CM | POA: Diagnosis not present

## 2019-05-15 DIAGNOSIS — M1611 Unilateral primary osteoarthritis, right hip: Secondary | ICD-10-CM | POA: Diagnosis not present

## 2019-05-18 DIAGNOSIS — M7061 Trochanteric bursitis, right hip: Secondary | ICD-10-CM | POA: Diagnosis not present

## 2019-05-18 DIAGNOSIS — M1611 Unilateral primary osteoarthritis, right hip: Secondary | ICD-10-CM | POA: Diagnosis not present

## 2019-05-18 DIAGNOSIS — M7918 Myalgia, other site: Secondary | ICD-10-CM | POA: Diagnosis not present

## 2019-05-18 DIAGNOSIS — M25551 Pain in right hip: Secondary | ICD-10-CM | POA: Diagnosis not present

## 2019-05-20 DIAGNOSIS — M7061 Trochanteric bursitis, right hip: Secondary | ICD-10-CM | POA: Diagnosis not present

## 2019-05-20 DIAGNOSIS — M1611 Unilateral primary osteoarthritis, right hip: Secondary | ICD-10-CM | POA: Diagnosis not present

## 2019-05-20 DIAGNOSIS — M25551 Pain in right hip: Secondary | ICD-10-CM | POA: Diagnosis not present

## 2019-05-20 DIAGNOSIS — M7918 Myalgia, other site: Secondary | ICD-10-CM | POA: Diagnosis not present

## 2019-05-25 DIAGNOSIS — M1611 Unilateral primary osteoarthritis, right hip: Secondary | ICD-10-CM | POA: Diagnosis not present

## 2019-05-25 DIAGNOSIS — M7918 Myalgia, other site: Secondary | ICD-10-CM | POA: Diagnosis not present

## 2019-05-25 DIAGNOSIS — M7061 Trochanteric bursitis, right hip: Secondary | ICD-10-CM | POA: Diagnosis not present

## 2019-05-25 DIAGNOSIS — M25551 Pain in right hip: Secondary | ICD-10-CM | POA: Diagnosis not present

## 2019-05-27 DIAGNOSIS — M7918 Myalgia, other site: Secondary | ICD-10-CM | POA: Diagnosis not present

## 2019-05-27 DIAGNOSIS — M7061 Trochanteric bursitis, right hip: Secondary | ICD-10-CM | POA: Diagnosis not present

## 2019-05-27 DIAGNOSIS — M25551 Pain in right hip: Secondary | ICD-10-CM | POA: Diagnosis not present

## 2019-05-27 DIAGNOSIS — M1611 Unilateral primary osteoarthritis, right hip: Secondary | ICD-10-CM | POA: Diagnosis not present

## 2019-06-01 DIAGNOSIS — M7918 Myalgia, other site: Secondary | ICD-10-CM | POA: Diagnosis not present

## 2019-06-01 DIAGNOSIS — M7061 Trochanteric bursitis, right hip: Secondary | ICD-10-CM | POA: Diagnosis not present

## 2019-06-01 DIAGNOSIS — M25551 Pain in right hip: Secondary | ICD-10-CM | POA: Diagnosis not present

## 2019-06-01 DIAGNOSIS — M1611 Unilateral primary osteoarthritis, right hip: Secondary | ICD-10-CM | POA: Diagnosis not present

## 2019-06-09 DIAGNOSIS — M7918 Myalgia, other site: Secondary | ICD-10-CM | POA: Diagnosis not present

## 2019-06-09 DIAGNOSIS — M7061 Trochanteric bursitis, right hip: Secondary | ICD-10-CM | POA: Diagnosis not present

## 2019-06-09 DIAGNOSIS — Z20828 Contact with and (suspected) exposure to other viral communicable diseases: Secondary | ICD-10-CM | POA: Diagnosis not present

## 2019-06-09 DIAGNOSIS — M1611 Unilateral primary osteoarthritis, right hip: Secondary | ICD-10-CM | POA: Diagnosis not present

## 2019-06-09 DIAGNOSIS — M25551 Pain in right hip: Secondary | ICD-10-CM | POA: Diagnosis not present

## 2019-06-24 DIAGNOSIS — M1611 Unilateral primary osteoarthritis, right hip: Secondary | ICD-10-CM | POA: Diagnosis not present

## 2019-06-24 DIAGNOSIS — M7061 Trochanteric bursitis, right hip: Secondary | ICD-10-CM | POA: Diagnosis not present

## 2019-06-24 DIAGNOSIS — M25551 Pain in right hip: Secondary | ICD-10-CM | POA: Diagnosis not present

## 2019-06-24 DIAGNOSIS — M7918 Myalgia, other site: Secondary | ICD-10-CM | POA: Diagnosis not present

## 2019-06-29 ENCOUNTER — Other Ambulatory Visit: Payer: Self-pay | Admitting: Endocrinology

## 2019-06-29 DIAGNOSIS — E890 Postprocedural hypothyroidism: Secondary | ICD-10-CM

## 2019-07-03 DIAGNOSIS — M1611 Unilateral primary osteoarthritis, right hip: Secondary | ICD-10-CM | POA: Diagnosis not present

## 2019-07-03 DIAGNOSIS — M25551 Pain in right hip: Secondary | ICD-10-CM | POA: Diagnosis not present

## 2019-07-03 DIAGNOSIS — M7061 Trochanteric bursitis, right hip: Secondary | ICD-10-CM | POA: Diagnosis not present

## 2019-07-03 DIAGNOSIS — M7918 Myalgia, other site: Secondary | ICD-10-CM | POA: Diagnosis not present

## 2019-07-08 DIAGNOSIS — M1611 Unilateral primary osteoarthritis, right hip: Secondary | ICD-10-CM | POA: Diagnosis not present

## 2019-07-08 DIAGNOSIS — M7918 Myalgia, other site: Secondary | ICD-10-CM | POA: Diagnosis not present

## 2019-07-08 DIAGNOSIS — M7061 Trochanteric bursitis, right hip: Secondary | ICD-10-CM | POA: Diagnosis not present

## 2019-07-08 DIAGNOSIS — M25551 Pain in right hip: Secondary | ICD-10-CM | POA: Diagnosis not present

## 2019-07-15 DIAGNOSIS — M25551 Pain in right hip: Secondary | ICD-10-CM | POA: Diagnosis not present

## 2019-07-15 DIAGNOSIS — M7918 Myalgia, other site: Secondary | ICD-10-CM | POA: Diagnosis not present

## 2019-07-15 DIAGNOSIS — M7061 Trochanteric bursitis, right hip: Secondary | ICD-10-CM | POA: Diagnosis not present

## 2019-07-15 DIAGNOSIS — M1611 Unilateral primary osteoarthritis, right hip: Secondary | ICD-10-CM | POA: Diagnosis not present

## 2019-07-22 DIAGNOSIS — M1611 Unilateral primary osteoarthritis, right hip: Secondary | ICD-10-CM | POA: Diagnosis not present

## 2019-07-22 DIAGNOSIS — M25551 Pain in right hip: Secondary | ICD-10-CM | POA: Diagnosis not present

## 2019-07-22 DIAGNOSIS — M7918 Myalgia, other site: Secondary | ICD-10-CM | POA: Diagnosis not present

## 2019-07-22 DIAGNOSIS — M7061 Trochanteric bursitis, right hip: Secondary | ICD-10-CM | POA: Diagnosis not present

## 2019-07-29 DIAGNOSIS — M7061 Trochanteric bursitis, right hip: Secondary | ICD-10-CM | POA: Diagnosis not present

## 2019-07-29 DIAGNOSIS — M1611 Unilateral primary osteoarthritis, right hip: Secondary | ICD-10-CM | POA: Diagnosis not present

## 2019-07-29 DIAGNOSIS — M7918 Myalgia, other site: Secondary | ICD-10-CM | POA: Diagnosis not present

## 2019-07-29 DIAGNOSIS — M25551 Pain in right hip: Secondary | ICD-10-CM | POA: Diagnosis not present

## 2019-08-05 DIAGNOSIS — M25551 Pain in right hip: Secondary | ICD-10-CM | POA: Diagnosis not present

## 2019-08-05 DIAGNOSIS — M1611 Unilateral primary osteoarthritis, right hip: Secondary | ICD-10-CM | POA: Diagnosis not present

## 2019-08-05 DIAGNOSIS — M7918 Myalgia, other site: Secondary | ICD-10-CM | POA: Diagnosis not present

## 2019-08-05 DIAGNOSIS — M7061 Trochanteric bursitis, right hip: Secondary | ICD-10-CM | POA: Diagnosis not present

## 2019-08-12 DIAGNOSIS — M7918 Myalgia, other site: Secondary | ICD-10-CM | POA: Diagnosis not present

## 2019-08-12 DIAGNOSIS — M7061 Trochanteric bursitis, right hip: Secondary | ICD-10-CM | POA: Diagnosis not present

## 2019-08-12 DIAGNOSIS — M25551 Pain in right hip: Secondary | ICD-10-CM | POA: Diagnosis not present

## 2019-08-12 DIAGNOSIS — M1611 Unilateral primary osteoarthritis, right hip: Secondary | ICD-10-CM | POA: Diagnosis not present

## 2019-08-17 DIAGNOSIS — M1611 Unilateral primary osteoarthritis, right hip: Secondary | ICD-10-CM | POA: Diagnosis not present

## 2019-08-17 DIAGNOSIS — M7918 Myalgia, other site: Secondary | ICD-10-CM | POA: Diagnosis not present

## 2019-08-17 DIAGNOSIS — M25551 Pain in right hip: Secondary | ICD-10-CM | POA: Diagnosis not present

## 2019-08-17 DIAGNOSIS — M7061 Trochanteric bursitis, right hip: Secondary | ICD-10-CM | POA: Diagnosis not present

## 2019-08-24 DIAGNOSIS — M25551 Pain in right hip: Secondary | ICD-10-CM | POA: Diagnosis not present

## 2019-08-24 DIAGNOSIS — M7061 Trochanteric bursitis, right hip: Secondary | ICD-10-CM | POA: Diagnosis not present

## 2019-08-24 DIAGNOSIS — M1611 Unilateral primary osteoarthritis, right hip: Secondary | ICD-10-CM | POA: Diagnosis not present

## 2019-08-24 DIAGNOSIS — M7918 Myalgia, other site: Secondary | ICD-10-CM | POA: Diagnosis not present

## 2019-09-02 DIAGNOSIS — M7918 Myalgia, other site: Secondary | ICD-10-CM | POA: Diagnosis not present

## 2019-09-02 DIAGNOSIS — M7061 Trochanteric bursitis, right hip: Secondary | ICD-10-CM | POA: Diagnosis not present

## 2019-09-02 DIAGNOSIS — M1611 Unilateral primary osteoarthritis, right hip: Secondary | ICD-10-CM | POA: Diagnosis not present

## 2019-09-02 DIAGNOSIS — M25551 Pain in right hip: Secondary | ICD-10-CM | POA: Diagnosis not present

## 2019-09-09 DIAGNOSIS — M7061 Trochanteric bursitis, right hip: Secondary | ICD-10-CM | POA: Diagnosis not present

## 2019-09-09 DIAGNOSIS — M1611 Unilateral primary osteoarthritis, right hip: Secondary | ICD-10-CM | POA: Diagnosis not present

## 2019-09-09 DIAGNOSIS — M25551 Pain in right hip: Secondary | ICD-10-CM | POA: Diagnosis not present

## 2019-09-09 DIAGNOSIS — M7918 Myalgia, other site: Secondary | ICD-10-CM | POA: Diagnosis not present

## 2019-09-16 DIAGNOSIS — M7061 Trochanteric bursitis, right hip: Secondary | ICD-10-CM | POA: Diagnosis not present

## 2019-09-16 DIAGNOSIS — M25551 Pain in right hip: Secondary | ICD-10-CM | POA: Diagnosis not present

## 2019-09-16 DIAGNOSIS — M7918 Myalgia, other site: Secondary | ICD-10-CM | POA: Diagnosis not present

## 2019-09-16 DIAGNOSIS — M1611 Unilateral primary osteoarthritis, right hip: Secondary | ICD-10-CM | POA: Diagnosis not present

## 2019-09-23 DIAGNOSIS — M1611 Unilateral primary osteoarthritis, right hip: Secondary | ICD-10-CM | POA: Diagnosis not present

## 2019-09-23 DIAGNOSIS — M7061 Trochanteric bursitis, right hip: Secondary | ICD-10-CM | POA: Diagnosis not present

## 2019-09-23 DIAGNOSIS — M25551 Pain in right hip: Secondary | ICD-10-CM | POA: Diagnosis not present

## 2019-09-23 DIAGNOSIS — M7918 Myalgia, other site: Secondary | ICD-10-CM | POA: Diagnosis not present

## 2019-10-21 ENCOUNTER — Other Ambulatory Visit: Payer: Self-pay

## 2019-10-21 ENCOUNTER — Telehealth: Payer: Self-pay | Admitting: Endocrinology

## 2019-10-21 DIAGNOSIS — E890 Postprocedural hypothyroidism: Secondary | ICD-10-CM

## 2019-10-21 MED ORDER — LEVOTHYROXINE SODIUM 75 MCG PO TABS: 75.0000 ug | ORAL_TABLET | Freq: Every day | ORAL | 0 refills | Status: DC

## 2019-10-21 MED ORDER — LEVOTHYROXINE SODIUM 75 MCG PO TABS: 75.0000 ug | ORAL_TABLET | Freq: Every day | ORAL | 0 refills | Status: AC

## 2019-10-21 NOTE — Telephone Encounter (Signed)
Outpatient Medication Detail   Disp Refills Start End   levothyroxine (SYNTHROID) 75 MCG tablet 30 tablet 0 10/21/2019    Sig - Route: Take 1 tablet (75 mcg total) by mouth daily with breakfast. - Oral   Sent to pharmacy as: levothyroxine (SYNTHROID) 75 MCG tablet   E-Prescribing Status: Receipt confirmed by pharmacy (10/21/2019  3:01 PM EDT)

## 2019-10-21 NOTE — Telephone Encounter (Signed)
please refill x 1  

## 2019-10-21 NOTE — Telephone Encounter (Signed)
Medication Refill Request  Did you call your pharmacy and request this refill first?  Yes, patient has move to [place] and will be establishing care there.   Name of medication? Levothyroxine 75 mcg  Is this a 90 day supply? If possible  Name and location of pharmacy?   [address], [address] in [place]    [phone]

## 2019-10-21 NOTE — Telephone Encounter (Signed)
Please advise. Uncertain if you want labs drawn in [place] to ensure proper dosage.

## 2019-11-26 DIAGNOSIS — Z0000 Encounter for general adult medical examination without abnormal findings: Secondary | ICD-10-CM | POA: Diagnosis not present

## 2019-11-26 DIAGNOSIS — E039 Hypothyroidism, unspecified: Secondary | ICD-10-CM | POA: Diagnosis not present

## 2019-11-26 DIAGNOSIS — E559 Vitamin D deficiency, unspecified: Secondary | ICD-10-CM | POA: Diagnosis not present

## 2019-11-26 DIAGNOSIS — Z1231 Encounter for screening mammogram for malignant neoplasm of breast: Secondary | ICD-10-CM | POA: Diagnosis not present

## 2020-01-13 DIAGNOSIS — H16143 Punctate keratitis, bilateral: Secondary | ICD-10-CM | POA: Diagnosis not present

## 2020-01-13 DIAGNOSIS — H16223 Keratoconjunctivitis sicca, not specified as Sjogren's, bilateral: Secondary | ICD-10-CM | POA: Diagnosis not present

## 2020-01-20 DIAGNOSIS — Z1231 Encounter for screening mammogram for malignant neoplasm of breast: Secondary | ICD-10-CM | POA: Diagnosis not present

## 2020-02-17 DIAGNOSIS — H16223 Keratoconjunctivitis sicca, not specified as Sjogren's, bilateral: Secondary | ICD-10-CM | POA: Diagnosis not present

## 2020-02-17 DIAGNOSIS — H02889 Meibomian gland dysfunction of unspecified eye, unspecified eyelid: Secondary | ICD-10-CM | POA: Diagnosis not present

## 2020-02-17 DIAGNOSIS — H16143 Punctate keratitis, bilateral: Secondary | ICD-10-CM | POA: Diagnosis not present

## 2020-05-02 DIAGNOSIS — Z0000 Encounter for general adult medical examination without abnormal findings: Secondary | ICD-10-CM | POA: Diagnosis not present

## 2020-05-02 DIAGNOSIS — Z23 Encounter for immunization: Secondary | ICD-10-CM | POA: Diagnosis not present

## 2020-05-02 DIAGNOSIS — E039 Hypothyroidism, unspecified: Secondary | ICD-10-CM | POA: Diagnosis not present

## 2020-08-29 DIAGNOSIS — Z01419 Encounter for gynecological examination (general) (routine) without abnormal findings: Secondary | ICD-10-CM | POA: Diagnosis not present

## 2020-08-29 DIAGNOSIS — N958 Other specified menopausal and perimenopausal disorders: Secondary | ICD-10-CM | POA: Diagnosis not present

## 2020-08-29 DIAGNOSIS — B3749 Other urogenital candidiasis: Secondary | ICD-10-CM | POA: Diagnosis not present

## 2020-08-29 DIAGNOSIS — E0500 Thyrotoxicosis with diffuse goiter without thyrotoxic crisis or storm: Secondary | ICD-10-CM | POA: Diagnosis not present

## 2020-08-29 DIAGNOSIS — Z1151 Encounter for screening for human papillomavirus (HPV): Secondary | ICD-10-CM | POA: Diagnosis not present

## 2020-08-29 DIAGNOSIS — Z01411 Encounter for gynecological examination (general) (routine) with abnormal findings: Secondary | ICD-10-CM | POA: Diagnosis not present

## 2020-09-15 DIAGNOSIS — M2022 Hallux rigidus, left foot: Secondary | ICD-10-CM | POA: Diagnosis not present

## 2020-09-15 DIAGNOSIS — R262 Difficulty in walking, not elsewhere classified: Secondary | ICD-10-CM | POA: Diagnosis not present

## 2020-09-15 DIAGNOSIS — L570 Actinic keratosis: Secondary | ICD-10-CM | POA: Diagnosis not present

## 2020-09-15 DIAGNOSIS — D485 Neoplasm of uncertain behavior of skin: Secondary | ICD-10-CM | POA: Diagnosis not present

## 2020-09-15 DIAGNOSIS — M2021 Hallux rigidus, right foot: Secondary | ICD-10-CM | POA: Diagnosis not present

## 2020-12-23 DIAGNOSIS — H16223 Keratoconjunctivitis sicca, not specified as Sjogren's, bilateral: Secondary | ICD-10-CM | POA: Diagnosis not present

## 2021-02-20 DIAGNOSIS — L814 Other melanin hyperpigmentation: Secondary | ICD-10-CM | POA: Diagnosis not present

## 2021-02-20 DIAGNOSIS — D1801 Hemangioma of skin and subcutaneous tissue: Secondary | ICD-10-CM | POA: Diagnosis not present

## 2021-02-20 DIAGNOSIS — L821 Other seborrheic keratosis: Secondary | ICD-10-CM | POA: Diagnosis not present

## 2021-03-22 DIAGNOSIS — M9902 Segmental and somatic dysfunction of thoracic region: Secondary | ICD-10-CM | POA: Diagnosis not present

## 2021-03-22 DIAGNOSIS — M9904 Segmental and somatic dysfunction of sacral region: Secondary | ICD-10-CM | POA: Diagnosis not present

## 2021-03-22 DIAGNOSIS — M545 Low back pain, unspecified: Secondary | ICD-10-CM | POA: Diagnosis not present

## 2021-03-22 DIAGNOSIS — M216X1 Other acquired deformities of right foot: Secondary | ICD-10-CM | POA: Diagnosis not present

## 2021-03-24 DIAGNOSIS — M545 Low back pain, unspecified: Secondary | ICD-10-CM | POA: Diagnosis not present

## 2021-03-24 DIAGNOSIS — M216X1 Other acquired deformities of right foot: Secondary | ICD-10-CM | POA: Diagnosis not present

## 2021-03-24 DIAGNOSIS — M9904 Segmental and somatic dysfunction of sacral region: Secondary | ICD-10-CM | POA: Diagnosis not present

## 2021-03-24 DIAGNOSIS — M9902 Segmental and somatic dysfunction of thoracic region: Secondary | ICD-10-CM | POA: Diagnosis not present

## 2021-03-29 DIAGNOSIS — M9902 Segmental and somatic dysfunction of thoracic region: Secondary | ICD-10-CM | POA: Diagnosis not present

## 2021-03-29 DIAGNOSIS — M216X1 Other acquired deformities of right foot: Secondary | ICD-10-CM | POA: Diagnosis not present

## 2021-03-29 DIAGNOSIS — M9904 Segmental and somatic dysfunction of sacral region: Secondary | ICD-10-CM | POA: Diagnosis not present

## 2021-03-29 DIAGNOSIS — M545 Low back pain, unspecified: Secondary | ICD-10-CM | POA: Diagnosis not present

## 2021-04-05 DIAGNOSIS — M545 Low back pain, unspecified: Secondary | ICD-10-CM | POA: Diagnosis not present

## 2021-04-05 DIAGNOSIS — M216X1 Other acquired deformities of right foot: Secondary | ICD-10-CM | POA: Diagnosis not present

## 2021-04-05 DIAGNOSIS — M9904 Segmental and somatic dysfunction of sacral region: Secondary | ICD-10-CM | POA: Diagnosis not present

## 2021-04-05 DIAGNOSIS — M9902 Segmental and somatic dysfunction of thoracic region: Secondary | ICD-10-CM | POA: Diagnosis not present

## 2021-04-12 DIAGNOSIS — M9902 Segmental and somatic dysfunction of thoracic region: Secondary | ICD-10-CM | POA: Diagnosis not present

## 2021-04-12 DIAGNOSIS — M545 Low back pain, unspecified: Secondary | ICD-10-CM | POA: Diagnosis not present

## 2021-04-12 DIAGNOSIS — M216X1 Other acquired deformities of right foot: Secondary | ICD-10-CM | POA: Diagnosis not present

## 2021-04-12 DIAGNOSIS — M9904 Segmental and somatic dysfunction of sacral region: Secondary | ICD-10-CM | POA: Diagnosis not present

## 2021-04-26 DIAGNOSIS — M216X1 Other acquired deformities of right foot: Secondary | ICD-10-CM | POA: Diagnosis not present

## 2021-04-26 DIAGNOSIS — M9902 Segmental and somatic dysfunction of thoracic region: Secondary | ICD-10-CM | POA: Diagnosis not present

## 2021-04-26 DIAGNOSIS — M9904 Segmental and somatic dysfunction of sacral region: Secondary | ICD-10-CM | POA: Diagnosis not present

## 2021-04-26 DIAGNOSIS — M545 Low back pain, unspecified: Secondary | ICD-10-CM | POA: Diagnosis not present

## 2021-05-05 DIAGNOSIS — Z01419 Encounter for gynecological examination (general) (routine) without abnormal findings: Secondary | ICD-10-CM | POA: Diagnosis not present

## 2021-05-08 DIAGNOSIS — Z0000 Encounter for general adult medical examination without abnormal findings: Secondary | ICD-10-CM | POA: Diagnosis not present

## 2021-05-08 DIAGNOSIS — E039 Hypothyroidism, unspecified: Secondary | ICD-10-CM | POA: Diagnosis not present

## 2021-05-12 DIAGNOSIS — M9902 Segmental and somatic dysfunction of thoracic region: Secondary | ICD-10-CM | POA: Diagnosis not present

## 2021-05-12 DIAGNOSIS — Z23 Encounter for immunization: Secondary | ICD-10-CM | POA: Diagnosis not present

## 2021-05-12 DIAGNOSIS — M9904 Segmental and somatic dysfunction of sacral region: Secondary | ICD-10-CM | POA: Diagnosis not present

## 2021-05-12 DIAGNOSIS — M216X1 Other acquired deformities of right foot: Secondary | ICD-10-CM | POA: Diagnosis not present

## 2021-05-12 DIAGNOSIS — M545 Low back pain, unspecified: Secondary | ICD-10-CM | POA: Diagnosis not present

## 2021-05-17 DIAGNOSIS — E039 Hypothyroidism, unspecified: Secondary | ICD-10-CM | POA: Diagnosis not present

## 2022-10-15 ENCOUNTER — Encounter: Payer: Self-pay | Admitting: Gastroenterology
